# Patient Record
Sex: Male | Born: 1960
Health system: Southern US, Community
[De-identification: ages and names within clinical notes are randomized; demographics above are authoritative.]

## PROBLEM LIST (undated history)

## (undated) DIAGNOSIS — G35 Multiple sclerosis: Secondary | ICD-10-CM

## (undated) HISTORY — DX: Multiple sclerosis: G35

---

## 2004-11-06 HISTORY — PX: CARDIAC CATHETERIZATION: SHX172

## 2005-04-21 ENCOUNTER — Encounter: Admission: RE | Admit: 2005-04-21 | Discharge: 2005-04-21 | Payer: Self-pay | Admitting: Internal Medicine

## 2005-05-23 ENCOUNTER — Encounter: Admission: RE | Admit: 2005-05-23 | Discharge: 2005-05-23 | Payer: Self-pay | Admitting: *Deleted

## 2005-05-25 ENCOUNTER — Ambulatory Visit (HOSPITAL_COMMUNITY): Admission: RE | Admit: 2005-05-25 | Discharge: 2005-05-26 | Payer: Self-pay | Admitting: *Deleted

## 2005-06-27 ENCOUNTER — Encounter: Admission: RE | Admit: 2005-06-27 | Discharge: 2005-06-27 | Payer: Self-pay | Admitting: Internal Medicine

## 2005-07-12 ENCOUNTER — Ambulatory Visit (HOSPITAL_COMMUNITY): Admission: RE | Admit: 2005-07-12 | Discharge: 2005-07-12 | Payer: Self-pay | Admitting: Neurology

## 2005-08-31 ENCOUNTER — Encounter (INDEPENDENT_AMBULATORY_CARE_PROVIDER_SITE_OTHER): Payer: Self-pay | Admitting: Specialist

## 2005-08-31 ENCOUNTER — Ambulatory Visit: Admission: RE | Admit: 2005-08-31 | Discharge: 2005-08-31 | Payer: Self-pay | Admitting: Neurology

## 2005-11-15 IMAGING — CR DG CHEST 2V
2 series · 2 of 2 positions shown · non-contrast
Comparison: none

CLINICAL DATA: Preop respiratory exam for cardiac procedure.
 TWO VIEW CHEST:
 Cardiomediastinal silhouette is unremarkable.  The lungs are clear.  No pleural effusions or pneumothorax.  The visualized upper abdomen and bony thorax are within normal limits.

[w chest pa]
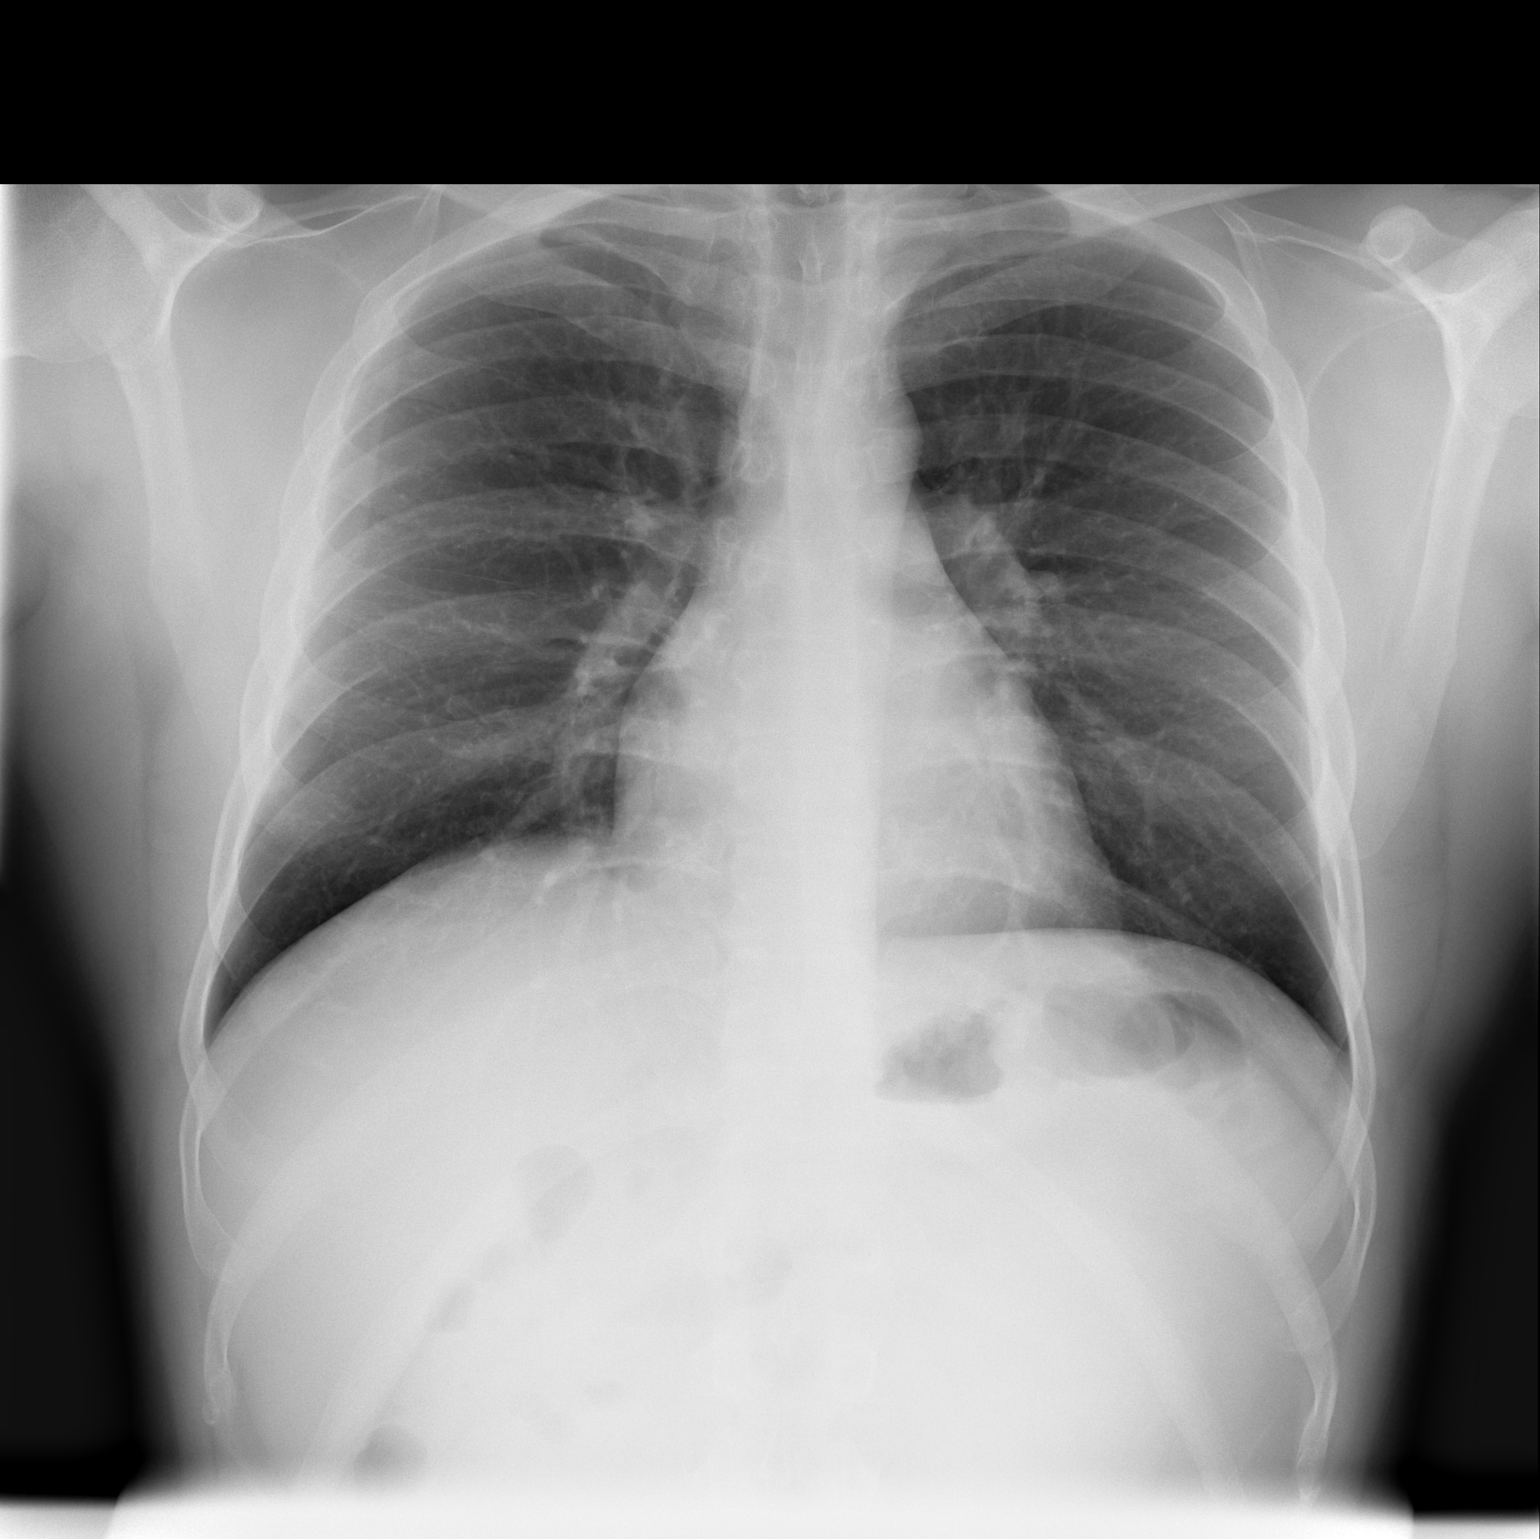

[w chest lat]
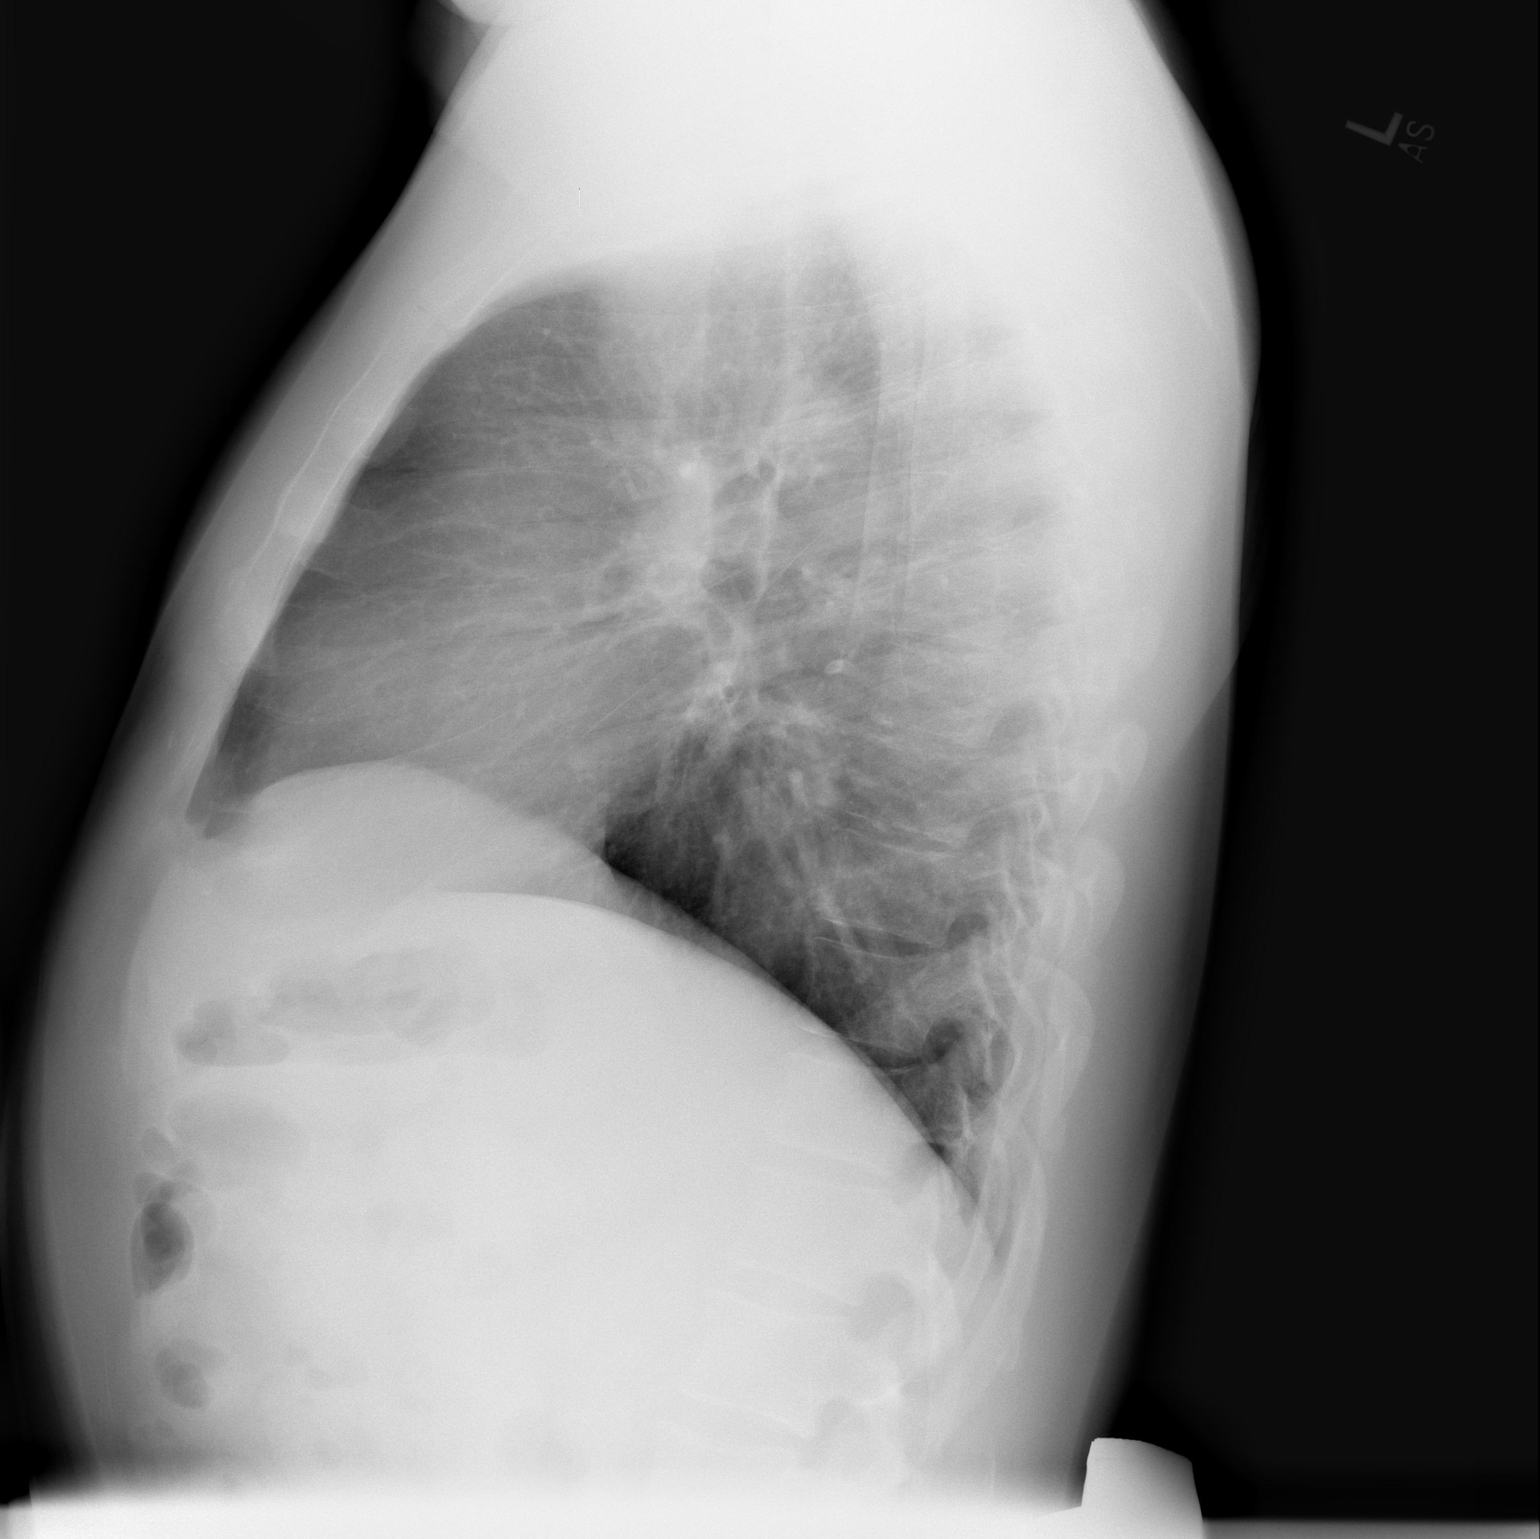

[2 of 2 positions shown; findings below may reference images not displayed]

IMPRESSION: No evidence of active cardiopulmonary disease.

## 2008-07-02 ENCOUNTER — Ambulatory Visit (HOSPITAL_COMMUNITY): Admission: RE | Admit: 2008-07-02 | Discharge: 2008-07-02 | Payer: Self-pay | Admitting: Neurology

## 2008-11-06 DIAGNOSIS — G35 Multiple sclerosis: Secondary | ICD-10-CM | POA: Insufficient documentation

## 2010-09-14 ENCOUNTER — Ambulatory Visit (HOSPITAL_COMMUNITY): Admission: RE | Admit: 2010-09-14 | Discharge: 2010-09-14 | Payer: Self-pay | Admitting: Neurology

## 2010-09-28 ENCOUNTER — Ambulatory Visit (HOSPITAL_COMMUNITY): Admission: RE | Admit: 2010-09-28 | Discharge: 2010-09-28 | Payer: Self-pay | Admitting: Internal Medicine

## 2010-11-26 ENCOUNTER — Encounter: Payer: Self-pay | Admitting: Internal Medicine

## 2011-03-24 NOTE — Op Note (Signed)
Bradley Nicholson, Bradley Nicholson NO.:  [id number]   MEDICAL RECORD NO.:  [id number]          PATIENT TYPE:  OUT   LOCATION:  DFTL                         FACILITY:  [facility]   PHYSICIAN:  Genene Churn. Love, M.D.    DATE OF BIRTH:  [birth year]   DATE OF PROCEDURE:  08/31/2005  DATE OF DISCHARGE:                                 OPERATIVE REPORT   This is a 50 year old gentleman who is being evaluated for an abnormality in  the brain stem on MRI.   TECHNICAL DESCRIPTION:  This patient was prepped and draped in the left  lateral decubitus position using Betadine and 1% Xylocaine.  The L4-L5  interspace was attempted x 3 without success and it was decided to give the  patient Valium and try the procedure under fluoroscopy.  The patient  tolerated the procedure well.           ______________________________  Genene Churn. Sandria Manly, M.D.     JML/MEDQ  D:  08/31/2005  T:  08/31/2005  Job:  [id number]

## 2011-03-24 NOTE — Cardiovascular Report (Signed)
NAMEAADITYA, LETIZIA NO.:  [id number]   MEDICAL RECORD NO.:  [id number]          PATIENT TYPE:  OIB   LOCATION:  2852                         FACILITY:  [facility]   PHYSICIAN:  Mark E. Severiano Gilbert, M.D.    DATE OF BIRTH:  Feb 07, 1961   DATE OF PROCEDURE:  05/25/2005  DATE OF DISCHARGE:                              CARDIAC CATHETERIZATION   PROCEDURES PERFORMED:  1.  Invasive left physiologic procedure with arrhythmia induction.  2.  EP study with isoproterenol infusion.  3.  Non-3-D mapping arhythmia substrate.  4.  Radiofrequency ablation of arrhythmia substrate.   DIAGNOSIS:  Wolff-Parkinson-White syndrome.   SURGEON:  Mark E. Peele, M.D.   ESTIMATED BLOOD LOSS:  Less than 3 mL.   COMPLICATIONS:  None.   DESCRIPTION OF PROCEDURE:  Patient was brought to the EP lab in the fasting  state.  Patient was prepped in the usual manner.  Conscious sedation was  obtained with intermittent injections of midazolam and Fentanyl.  The left  femoral vein was cannulated twice with two 7 [place] short safe sheaths after  anesthesia was obtained with 1% lidocaine without difficulty.  The right  femoral vein was cannulated with an 8 [place] and a 7 [place] short safe  sheath after 1% lidocaine infiltration for anesthesia.  Lastly, the right  femoral artery was cannulated with a 7 [place] safe sheath short.  Via the  venous catheters a quadripolar fix curve catheter was positioned in the  right ventricular apex.  Quadripolar fix curve catheter was positioned in  the right atrial appendage, a hexapolar curve catheter was positioned in the  His bundle electrogram area, and finally a decapolar CS catheter was placed  in the proximal coronary sinus.  Because of difficulty with positioning the  coronary sinus catheter along the left lateral annulus, an EPT standard  curve was easily retrograded across the aortic valve under fluoroscopic  guidance and positioned along the left lateral annulus  for determining an  annular P-wave.  Baseline electrocardiographic rhythm demonstrated normal  sinus rhythm, bradycardia, cycle length was 1278 msec.  There was a grossly  preexcited QRS consistent with a diagnosis of WPW.  The HV interval was 0  msec. The AH time was 89 msec.  The retrograde block cycle length for  Wenckebach was 380 msec, retrograde activation was nondecremental and  eccentric.  The antegrade block cycle length of AV conduction down the  accessory pathway was 330 msec consistent with a nonmalignant accessory  pathway based on antegrade conduction properties. There was no easily  induced wide or narrow complex tachycardia.  Because of the diagnosis of  WPW, the standard curve EPT catheter was used to map the mitral annulus both  below the valve as well as on the annulus of the mitral valve.  It  is very  easy to map earliest retrograde and antegrade excitation to the left lateral  mitral annulus consistent with surface EKG vector of the Delta wave.  After  mapping, a site was obtained underneath the valve with excellent AV  electrogram consistent with pathway function.  Radiofrequency  energy was  applied with excellent heating characteristics to 50 to 60% degrees C.  There was rapid loss of antegrade conduction of the accessory pathway.  After a one minute total burn, two 15-second reinforcing burns were  performed.  All catheters were removed from the body after waiting 25  minutes without return of preexcitation.  While the patient was being taken  off the table, it was noticed that his EKG reverted back to her preexcited  rhythm.  Since we had not completely broken the field and access was still  available, we reprepped the area.  A quadripolar catheter was advanced into  the high right atrium and a quadripolar catheter was positioned in the right  ventricle for atrial and ventricular backup pacing.  Both a standard curve  and a small curve EPT catheter was used to map  the mitral valve annulus.  Again, a desirable site was obtained.  Multiple applications of  radiofrequency energy were performed all with transient loss of antegrade  preexcitation which returned immediately after ending the radiofrequency  application.  Most of these were related to relatively poor heating  characteristics, although the final burn performed in view of the retrograde  aortic approach at temperature characteristics of 50 to 55 degrees C.  Because of the time of the procedure and the reluctance to perform a  transeptal puncture after anticoagulation in retrograde aortic approach,,  their case was concluded.  All catheters and sheaths were removed without  difficulty.  Hemostasis was obtained with direct pressure.  The patient  tolerated the procedure well and recovered well from conscious sedation, was  hemodynamically stable throughout the case.   FINDINGS:  Left lateral accessory pathway with antegrade and retrograde  properties.  Retrograde properties at baseline are consistent with  initiation of a tachycardia of approximately 150 beats per minute.  Antegrade properties of the accessory pathway were clearly nonmalignant with  block cycle length of approximately 300 to 350 msec.   PLAN:  1.  Aspirin therapy 325 mg p.o. daily for one month given left-sided      ablation.  2.  Consideration of freedom from rhythm based on partial ablation of      accessory pathway conduction properties.  3.  Consideration of either a class I agent or return for transeptal      approach to ablate the accessory pathway.       MEP/MEDQ  D:  05/25/2005  T:  05/25/2005  Job:  [id number]

## 2011-12-26 ENCOUNTER — Encounter: Payer: Self-pay | Admitting: Pharmacist

## 2011-12-26 ENCOUNTER — Ambulatory Visit (INDEPENDENT_AMBULATORY_CARE_PROVIDER_SITE_OTHER): Payer: Self-pay | Admitting: Pharmacist

## 2011-12-26 VITALS — BP 138/81 | HR 64 | Ht 72.0 in | Wt 200.0 lb

## 2011-12-26 DIAGNOSIS — G35 Multiple sclerosis: Secondary | ICD-10-CM

## 2011-12-26 DIAGNOSIS — G35D Multiple sclerosis, unspecified: Secondary | ICD-10-CM

## 2011-12-26 NOTE — Progress Notes (Signed)
  Subjective:    Patient ID: Bradley Nicholson, male    DOB: 19-Oct-1961, 51 y.o.   MRN: [id number]  HPI Patient arrives in good spirits for medication review.  Reports seeing Dr. Sandria Manly as primary care provider.  Reports being diagnosed with Multiple Sclerosis for 3 years and states she is under acceptable level of control.      Review of Systems     Objective:   Physical Exam        Assessment & Plan:  Following medication review, no suggestions for change.  Complete medication list provided to patient.  Total time in face to face medication review: 10 minutes.  Patient seen with: Su Hilt, PharmD Candidate and Albertine Grates, Pharmacy Resident.

## 2011-12-26 NOTE — Progress Notes (Signed)
  Subjective:    Patient ID: Bradley Nicholson, male    DOB: 1961-05-11, 51 y.o.   MRN: [id number]  HPI Reviewed and agree with Dr. Macky Lower management.    Review of Systems     Objective:   Physical Exam        Assessment & Plan:

## 2011-12-26 NOTE — Assessment & Plan Note (Signed)
Following medication review, no suggestions for change.  Complete medication list provided to patient.  Total time in face to face medication review: 10 minutes.  Patient seen with: Su Hilt, PharmD Candidate and Albertine Grates, Pharmacy Resident.

## 2013-07-23 ENCOUNTER — Other Ambulatory Visit (HOSPITAL_COMMUNITY): Payer: Self-pay | Admitting: Neurology

## 2013-07-25 ENCOUNTER — Telehealth: Payer: Self-pay | Admitting: Neurology

## 2013-07-25 NOTE — Telephone Encounter (Signed)
Reassigned to Dr. Penumalli. 

## 2013-07-25 NOTE — Telephone Encounter (Signed)
Patient has not been seen since 02/2012.  Former Love patient needs assignment and appt.  Sent message to triage for assignment.

## 2013-07-28 NOTE — Telephone Encounter (Signed)
Patient has been assigned to Dr Marjory Lies

## 2013-08-25 ENCOUNTER — Other Ambulatory Visit (HOSPITAL_COMMUNITY): Payer: Self-pay | Admitting: Diagnostic Neuroimaging

## 2013-11-10 ENCOUNTER — Ambulatory Visit: Payer: Self-pay | Admitting: Diagnostic Neuroimaging

## 2013-12-15 ENCOUNTER — Other Ambulatory Visit (HOSPITAL_COMMUNITY): Payer: Self-pay | Admitting: Diagnostic Neuroimaging

## 2013-12-15 NOTE — Telephone Encounter (Signed)
This is a former Love patient who has not been seen since 02/2012.  He owes a CB balance and No showed the last appt he was scheduled for in Jan 2015.  I called the home number we have on file.  Phone rang several times, them a message picked up asking to enter the remote access code.  I called the work number on file, it did not ring.  Message asked to enter the phone number.  I called a cell number we had on file in [employer] [phone], each time I called, the phone would ring twice then it would start clicking and disconnect.  I called the pharmacy to see if they had a different number for the patient.  They had [phone].  I called this number.  Spoke with the patient.  He said he will call the office to discuss no show and appt.

## 2013-12-30 ENCOUNTER — Ambulatory Visit (INDEPENDENT_AMBULATORY_CARE_PROVIDER_SITE_OTHER): Payer: 59 | Admitting: Diagnostic Neuroimaging

## 2013-12-30 ENCOUNTER — Encounter: Payer: Self-pay | Admitting: Diagnostic Neuroimaging

## 2013-12-30 ENCOUNTER — Encounter (INDEPENDENT_AMBULATORY_CARE_PROVIDER_SITE_OTHER): Payer: Self-pay

## 2013-12-30 VITALS — BP 108/69 | HR 59 | Temp 97.9°F | Ht 71.25 in | Wt 204.0 lb

## 2013-12-30 DIAGNOSIS — G35 Multiple sclerosis: Secondary | ICD-10-CM

## 2013-12-30 NOTE — Progress Notes (Signed)
[facility]  PATIENT: Bradley Nicholson DOB: 12/15/1960  REFERRING CLINICIAN:  HISTORY FROM: patient and wife REASON FOR VISIT: follow up (transfer, Dr. Sandria Manly)   HISTORICAL  CHIEF COMPLAINT:  Chief Complaint  Patient presents with  . Establish Care    Prior pt of Dr. Sandria Manly  . Multiple Sclerosis    HISTORY OF PRESENT ILLNESS:   UPDATE 12/30/13: Since last visit patient doing well. No further neurologic symptoms. Patient is tolerating Rebif injections. No skin site reactions.  I reviewed patient's initial symptoms in August 2006. Patient tells me that 2 weeks prior to onset of double vision 2006, patient had had palpitations, diagnosed with Wolff-Parkinson-White syndrome, and underwent cardiac catheterization with radiofrequency ablation. When patient developed double vision, he had woke up with symptoms. Symptoms lasted approximately 2 days.  PRIOR HPI (02/23/12, Dr. Sandria Manly): "53 year old right-handed married African American male first seen  06/30/05 for dizziness and double vision with an abnormal MRI study of the brain  06/27/05 showing a 3-3.5 mm lesion in the brainstem midbrain on the left, adjacent to the aqueduct near the  superior collicus. CBC, CMP, ANA, lipid profile was normal except LDL 155. Examination revealed acuity at 20/20 right eye, 20/30 in the left and red lens showed vertical diplopia. LP 08/31/05 showed negative ACE  and VDRL with glucose 54, protein 33, no red blood cells, one white blood cell, and positive CSF oligoclonal banding with normal CSF IgG index. He began rebif on Mondays, Wednesdays, and Fridays 12/06. He has not had significant side effects from the medication. He denies bowel or bladder incontinence. He has no history of Lhermitte's sign. His memory has been good. He has no injection site sores. His last blood studies 06/03/10 were normal CBC and CMP. MRIs of the brain and cervical spine 09/14/2010 without and with contrast enhancement were  normal except for degenerative changes at C4-5. 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 He exercises on a regular basis."   REVIEW OF SYSTEMS: Full 14 system review of systems performed and notable only for as per history of present illness. Otherwise negative.  ALLERGIES: No Known Allergies  HOME MEDICATIONS: Outpatient Prescriptions Prior to Visit  Medication Sig Dispense Refill  . interferon beta-1a (REBIF) 44 MCG/0.5ML injection Inject 0.5 mLs (44 mcg total) into the skin 3 (three) times a week.  6 mL  0   No facility-administered medications prior to visit.    PAST MEDICAL HISTORY: History reviewed. No pertinent past medical history.  PAST SURGICAL HISTORY: Past Surgical History  Procedure Laterality Date  . Cardiac catheterization  2006    FAMILY HISTORY: Family History  Problem Relation Age of Onset  . Family history unknown: Yes    SOCIAL HISTORY:  History   Social History  . Marital Status: Married    Spouse Name: N/A    Number of Children: 1  . Years of Education: N/A   Occupational History  .  [employer]   Social History Main Topics  . Smoking status: Never Smoker   . Smokeless tobacco: Never Used  . Alcohol Use: Yes     Comment: 1 beverage  per week  . Drug Use: No  . Sexual Activity: Not on file   Other Topics Concern  . Not on file   Social History Narrative   Patient lives at home with his spouse.   Caffeine Use: does consume     PHYSICAL EXAM  Filed Vitals:  12/30/13 1033  BP: 108/69  Pulse: 59  Temp: 97.9 F (36.6 C)  TempSrc: Oral  Height: 5' 11.25" (1.81 m)  Weight: 204 lb (92.534 kg)    Not recorded    Body mass index is 28.25 kg/(m^2).  GENERAL EXAM: Patient is in no distress; well developed, nourished and groomed; neck is supple  CARDIOVASCULAR: Regular rate and rhythm, no murmurs, no carotid bruits  NEUROLOGIC: MENTAL STATUS: awake, alert, oriented to person, place and time, recent  and remote memory intact, normal attention and concentration, language fluent, comprehension intact, naming intact, fund of knowledge appropriate CRANIAL NERVE: no papilledema on fundoscopic exam, pupils equal and reactive to light, visual fields full to confrontation, extraocular muscles intact, no nystagmus, facial sensation and strength symmetric, hearing intact, palate elevates symmetrically, uvula midline, shoulder shrug symmetric, tongue midline. MOTOR: normal bulk and tone, full strength in the BUE, BLE SENSORY: normal and symmetric to light touch, pinprick, temperature, vibration COORDINATION: finger-nose-finger, fine finger movements normal REFLEXES: deep tendon reflexes present and symmetric GAIT/STATION: narrow based gait; able to walk on toes, heels and tandem; romberg is negative    DIAGNOSTIC DATA (LABS, IMAGING, TESTING) - I reviewed patient records, labs, notes, testing and imaging myself where available.  No results found for this basename: WBC, HGB, HCT, MCV, PLT   No results found for this basename: na, k, cl, co2, glucose, bun, creatinine, calcium, prot, albumin, ast, alt, alkphos, bilitot, gfrnonaa, gfraa   No results found for this basename: CHOL, HDL, LDLCALC, LDLDIRECT, TRIG, CHOLHDL   No results found for this basename: HGBA1C   No results found for this basename: VITAMINB12   No results found for this basename: TSH   I reviewed images myself, but do not agree with brain interpretation. I have reported my interpretation below. -VRP  09/15/10 MRI brain - subtle T2 hyperintense lesion in the midbrain on the left adjacent to the aqueduct, near the superior colliculus and fourth cranial nerve nucleus. Stable from prior studies. No abnl enhancement.  09/15/10 MRI cervical spine: 1. Stable, normal MRI appearance of the cervical and visualized thoracic spinal cord.  2. No acute findings in the cervical spine. Cervical degenerative changes, maximal at  C4-C5.    ASSESSMENT AND PLAN  53 y.o. year old male here with multiple sclerosis since 2006. Symptoms stable on rebif.  PLAN: - continue rebif - annual CBC, LFT with PCP - annual lipid, diabetes and BP screening with PCP - consider surveillance MRI brain next year   Return in about 1 year (around 12/30/2014).    Suanne MarkerVIKRAM R. Jnaya Butrick, MD 12/30/2013, 11:24 AM Certified in Neurology, Neurophysiology and Neuroimaging  [facility] [address], [address] [place], [place] [address] [phone]

## 2013-12-30 NOTE — Patient Instructions (Signed)
Continue rebif.  Have PCP check CBC, CMP, lipid panel, fasting sugar.

## 2014-02-02 ENCOUNTER — Other Ambulatory Visit (HOSPITAL_COMMUNITY): Payer: Self-pay | Admitting: Diagnostic Neuroimaging

## 2014-10-15 ENCOUNTER — Telehealth: Payer: Self-pay | Admitting: Diagnostic Neuroimaging

## 2014-10-15 NOTE — Telephone Encounter (Signed)
I have contacted ins and provided them will all clinical information requested.  They will review this info to determine if they can grant an exception and cover this med.  They will notify patient of outcome once decision has been made.  I called the patient back.  Got no answer.  Left message.

## 2014-10-15 NOTE — Telephone Encounter (Signed)
Pt's insurance is no longer covering REBIF 44 MCG/0.5ML injection.  Insurance gave him 2 options Avonex Injection or Ampyra.  Please call and advise.  Patient prefers to have Avonex.  Please call and advise.

## 2014-12-30 ENCOUNTER — Ambulatory Visit (INDEPENDENT_AMBULATORY_CARE_PROVIDER_SITE_OTHER): Payer: 59 | Admitting: Diagnostic Neuroimaging

## 2014-12-30 ENCOUNTER — Encounter: Payer: Self-pay | Admitting: Diagnostic Neuroimaging

## 2014-12-30 VITALS — BP 128/81 | HR 55 | Temp 97.3°F | Ht 71.0 in | Wt 209.6 lb

## 2014-12-30 DIAGNOSIS — G35 Multiple sclerosis: Secondary | ICD-10-CM

## 2014-12-30 NOTE — Progress Notes (Signed)
[facility]  PATIENT: Bradley Nicholson DOB: [birth year]  REFERRING CLINICIAN:  HISTORY FROM: patient and wife REASON FOR VISIT: follow up (transfer, Dr. Sandria Manly)   HISTORICAL  CHIEF COMPLAINT:  Chief Complaint  Patient presents with  . Follow-up    MS    HISTORY OF PRESENT ILLNESS:   UPDATE 12/30/14: Since last visit, doing about the same. No new neuro symptoms. Some intermittent cramps in hands. Tolerating rebif.  UPDATE 12/30/13: Since last visit patient doing well. No further neurologic symptoms. Patient is tolerating Rebif injections. No skin site reactions. I reviewed patient's initial symptoms in August 2006. Patient tells me that 2 weeks prior to onset of double vision 2006, patient had had palpitations, diagnosed with Wolff-Parkinson-White syndrome, and underwent cardiac catheterization with radiofrequency ablation. When patient developed double vision, he had woke up with symptoms. Symptoms lasted approximately 2 days.  PRIOR HPI (02/23/12, Dr. Sandria Manly): "54 year old right-handed married African American male first seen 06/30/05 for dizziness and double vision with an abnormal MRI study of the brain  06/27/05 showing a 3-3.5 mm lesion in the brainstem midbrain on the left, adjacent to the aqueduct near the  superior collicus. CBC, CMP, ANA, lipid profile was normal except LDL 155. Examination revealed acuity at 20/20 right eye, 20/30 in the left and red lens showed vertical diplopia. LP 08/31/05 showed negative ACE  and VDRL with glucose 54, protein 33, no red blood cells, one white blood cell, and positive CSF oligoclonal banding with normal CSF IgG index. He began rebif on Mondays, Wednesdays, and Fridays 12/06. He has not had significant side effects from the medication. He denies bowel or bladder incontinence. He has no history of Lhermitte's sign. His memory has been good. He has no injection site sores. His last blood studies 06/03/10 were normal CBC and CMP. MRIs of  the brain and cervical spine 09/14/2010 without and with contrast enhancement were normal except for degenerative changes at C4-5. 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 He exercises on a regular basis."   REVIEW OF SYSTEMS: Full 14 system review of systems performed and notable only for as per history of present illness. Otherwise negative.  ALLERGIES: No Known Allergies  HOME MEDICATIONS: Outpatient Prescriptions Prior to Visit  Medication Sig Dispense Refill  . REBIF 44 MCG/0.5ML injection INJECT 0.5 MLS (44 MCG TOTAL) INTO THE SKIN 3 (THREE) TIMES A WEEK. 6 mL 12   No facility-administered medications prior to visit.    PAST MEDICAL HISTORY: History reviewed. No pertinent past medical history.  PAST SURGICAL HISTORY: Past Surgical History  Procedure Laterality Date  . Cardiac catheterization  2006    FAMILY HISTORY: Family History  Problem Relation Age of Onset  . Healthy Mother   . Healthy Father     SOCIAL HISTORY:  History   Social History  . Marital Status: Married    Spouse Name: N/A  . Number of Children: 1  . Years of Education: N/A   Occupational History  .  [employer]   Social History Main Topics  . Smoking status: Never Smoker   . Smokeless tobacco: Never Used  . Alcohol Use: Yes     Comment: 1 beverage  per week  . Drug Use: No  . Sexual Activity: Not on file   Other Topics Concern  . Not on file   Social History Narrative   Patient lives at home with his spouse.   Caffeine Use: does consume  PHYSICAL EXAM  Filed Vitals:   12/30/14 0823  BP: 128/81  Pulse: 55  Temp: 97.3 F (36.3 C)  TempSrc: Oral  Height:  (1.803 m)  Weight: 209 lb 9.6 oz (95.074 kg)    Not recorded      Body mass index is 29.25 kg/(m^2).  GENERAL EXAM: Patient is in no distress; well developed, nourished and groomed; neck is supple  CARDIOVASCULAR: Regular rate and rhythm, no murmurs, no carotid  bruits  NEUROLOGIC: MENTAL STATUS: awake, alert, language fluent, comprehension intact, naming intact, fund of knowledge appropriate CRANIAL NERVE: pupils equal and reactive to light, visual fields full to confrontation, extraocular muscles intact, no nystagmus, facial sensation and strength symmetric, hearing intact, palate elevates symmetrically, uvula midline, shoulder shrug symmetric, tongue midline. MOTOR: normal bulk and tone, full strength in the BUE, BLE SENSORY: normal and symmetric to light touch, temperature, vibration COORDINATION: finger-nose-finger, fine finger movements normal REFLEXES: deep tendon reflexes present and symmetric; BRISK AT KNEES; ANKLES 1. GAIT/STATION: narrow based gait; able to walk tandem; romberg is negative    DIAGNOSTIC DATA (LABS, IMAGING, TESTING) - I reviewed patient records, labs, notes, testing and imaging myself where available.  No results found for: WBC No results found for: NA No results found for: CHOL No results found for: HGBA1C No results found for: VITAMINB12 No results found for: TSH   I reviewed images myself, but do not agree with MRI brain report. I have reported my interpretation below. -VRP  09/15/10 MRI brain - (report states normal brain); However I reviewed images myself and there is a subtle T2 hyperintense lesion in the midbrain on the left adjacent to the aqueduct, near the superior colliculus and fourth cranial nerve nucleus. Stable from prior studies. No abnl enhancement. -VRP  09/15/10 MRI cervical spine: 1. Stable, normal MRI appearance of the cervical and visualized thoracic spinal cord.  2. No acute findings in the cervical spine. Cervical degenerative changes, maximal at C4-C5.    ASSESSMENT AND PLAN  54 y.o. year old male here with multiple sclerosis since 2006. Symptoms stable on rebif.  PLAN: - continue rebif - check CBC, CMP - surveillance MRI brain and cervical spine   Orders Placed This Encounter   Procedures  . MR Brain W Wo Contrast  . MR Cervical Spine W Wo Contrast  . CBC with Differential/Platelet  . Comprehensive metabolic panel   Return in about 6 months (around 06/30/2015).    Suanne Marker, MD 12/30/2014, 8:56 AM Certified in Neurology, Neurophysiology and Neuroimaging  [facility] [address], [address] [place], [place] [address] [phone]

## 2014-12-30 NOTE — Patient Instructions (Signed)
I will check MRI scans and labs.   Continue rebif.

## 2014-12-31 LAB — CBC WITH DIFFERENTIAL/PLATELET
Basophils Absolute: 0 10*3/uL (ref 0.0–0.2)
Basos: 1 %
Eos: 1 %
Eosinophils Absolute: 0.1 10*3/uL (ref 0.0–0.4)
HCT: 40.6 % (ref 37.5–51.0)
Hemoglobin: 13.8 g/dL (ref 12.6–17.7)
Immature Grans (Abs): 0 10*3/uL (ref 0.0–0.1)
Immature Granulocytes: 0 %
Lymphocytes Absolute: 1.6 10*3/uL (ref 0.7–3.1)
Lymphs: 31 %
MCH: 29.9 pg (ref 26.6–33.0)
MCHC: 34 g/dL (ref 31.5–35.7)
MCV: 88 fL (ref 79–97)
Monocytes Absolute: 0.5 10*3/uL (ref 0.1–0.9)
Monocytes: 10 %
Neutrophils Absolute: 2.9 10*3/uL (ref 1.4–7.0)
Neutrophils Relative %: 57 %
Platelets: 211 10*3/uL (ref 150–379)
RBC: 4.61 x10E6/uL (ref 4.14–5.80)
RDW: 14.4 % (ref 12.3–15.4)
WBC: 5 10*3/uL (ref 3.4–10.8)

## 2014-12-31 LAB — COMPREHENSIVE METABOLIC PANEL
ALT: 40 IU/L (ref 0–44)
AST: 27 IU/L (ref 0–40)
Albumin/Globulin Ratio: 1.7 (ref 1.1–2.5)
Albumin: 4.4 g/dL (ref 3.5–5.5)
Alkaline Phosphatase: 61 IU/L (ref 39–117)
BUN/Creatinine Ratio: 15 (ref 9–20)
BUN: 16 mg/dL (ref 6–24)
Bilirubin Total: 0.6 mg/dL (ref 0.0–1.2)
CO2: 25 mmol/L (ref 18–29)
Calcium: 9.6 mg/dL (ref 8.7–10.2)
Chloride: 102 mmol/L (ref 97–108)
Creatinine, Ser: 1.05 mg/dL (ref 0.76–1.27)
GFR calc Af Amer: 93 mL/min/{1.73_m2} (ref >59)
GFR calc non Af Amer: 80 mL/min/{1.73_m2} (ref >59)
Globulin, Total: 2.6 g/dL (ref 1.5–4.5)
Glucose: 92 mg/dL (ref 65–99)
Potassium: 4.6 mmol/L (ref 3.5–5.2)
Sodium: 142 mmol/L (ref 134–144)
Total Protein: 7 g/dL (ref 6.0–8.5)

## 2015-01-06 ENCOUNTER — Ambulatory Visit (INDEPENDENT_AMBULATORY_CARE_PROVIDER_SITE_OTHER): Payer: 59

## 2015-01-06 DIAGNOSIS — G35 Multiple sclerosis: Secondary | ICD-10-CM

## 2015-01-07 MED ORDER — GADOPENTETATE DIMEGLUMINE 469.01 MG/ML IV SOLN: 20.0000 mL | Freq: Once | INTRAVENOUS | Status: AC | PRN

## 2015-01-25 ENCOUNTER — Encounter: Payer: Self-pay | Admitting: *Deleted

## 2015-03-08 ENCOUNTER — Other Ambulatory Visit: Payer: Self-pay | Admitting: Diagnostic Neuroimaging

## 2015-06-30 ENCOUNTER — Ambulatory Visit: Payer: Self-pay | Admitting: Diagnostic Neuroimaging

## 2015-06-30 ENCOUNTER — Telehealth: Payer: Self-pay | Admitting: *Deleted

## 2015-06-30 NOTE — Telephone Encounter (Signed)
Pt No showed for appt today.

## 2015-07-01 ENCOUNTER — Encounter: Payer: Self-pay | Admitting: Diagnostic Neuroimaging

## 2015-07-22 ENCOUNTER — Encounter: Payer: Self-pay | Admitting: Diagnostic Neuroimaging

## 2015-07-22 ENCOUNTER — Ambulatory Visit (INDEPENDENT_AMBULATORY_CARE_PROVIDER_SITE_OTHER): Payer: 59 | Admitting: Diagnostic Neuroimaging

## 2015-07-22 VITALS — BP 119/68 | HR 78 | Ht 71.0 in | Wt 201.0 lb

## 2015-07-22 DIAGNOSIS — G35 Multiple sclerosis: Secondary | ICD-10-CM

## 2015-07-22 NOTE — Patient Instructions (Signed)
Continue rebif.

## 2015-07-22 NOTE — Progress Notes (Signed)
[facility]  PATIENT: Bradley Nicholson DOB: 1961-05-24  REFERRING CLINICIAN:  HISTORY FROM: patient and wife REASON FOR VISIT: follow up (former Dr. Sandria Manly patient)   HISTORICAL  CHIEF COMPLAINT:  Chief Complaint  Patient presents with  . Multiple sclerosis    rm 7  . Follow-up    6 months    HISTORY OF PRESENT ILLNESS:   UPDATE 07/22/15: Doing well. No new neurologic symptoms. Tolerating rebif. MRI scans and labs are stable.  UPDATE 12/30/14: Since last visit, doing about the same. No new neuro symptoms. Some intermittent cramps in hands. Tolerating rebif.  UPDATE 12/30/13: Since last visit patient doing well. No further neurologic symptoms. Patient is tolerating Rebif injections. No skin site reactions. I reviewed patient's initial symptoms in August 2006. Patient tells me that 2 weeks prior to onset of double vision 2006, patient had had palpitations, diagnosed with Wolff-Parkinson-White syndrome, and underwent cardiac catheterization with radiofrequency ablation. When patient developed double vision, he had woke up with symptoms. Symptoms lasted approximately 2 days.  PRIOR HPI (02/23/12, Dr. Sandria Manly): "54 year old right-handed married African American male first seen 06/30/05 for dizziness and double vision with an abnormal MRI study of the brain  06/27/05 showing a 3-3.5 mm lesion in the brainstem midbrain on the left, adjacent to the aqueduct near the  superior collicus. CBC, CMP, ANA, lipid profile was normal except LDL 155. Examination revealed acuity at 20/20 right eye, 20/30 in the left and red lens showed vertical diplopia. LP 08/31/05 showed negative ACE  and VDRL with glucose 54, protein 33, no red blood cells, one white blood cell, and positive CSF oligoclonal banding with normal CSF IgG index. He began rebif on Mondays, Wednesdays, and Fridays 12/06. He has not had significant side effects from the medication. He denies bowel or bladder incontinence. He has no  history of Lhermitte's sign. His memory has been good. He has no injection site sores. His last blood studies 06/03/10 were normal CBC and CMP. MRIs of the brain and cervical spine 09/14/2010 without and with contrast enhancement were normal except for degenerative changes at C4-5. 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 He exercises on a regular basis."    REVIEW OF SYSTEMS: Full 14 system review of systems performed and notable only for as per history of present illness. Otherwise negative.  ALLERGIES: No Known Allergies  HOME MEDICATIONS: Outpatient Prescriptions Prior to Visit  Medication Sig Dispense Refill  . REBIF 44 MCG/0.5ML SOSY injection INJECT 0.5 MLS (44 MCG TOTAL) INTO THE SKIN 3 (THREE) TIMES A WEEK. 12 Syringe 6   No facility-administered medications prior to visit.    PAST MEDICAL HISTORY: Past Medical History  Diagnosis Date  . Multiple sclerosis     PAST SURGICAL HISTORY: Past Surgical History  Procedure Laterality Date  . Cardiac catheterization  2006    FAMILY HISTORY: Family History  Problem Relation Age of Onset  . Healthy Mother   . Healthy Father     SOCIAL HISTORY:  Social History   Social History  . Marital Status: Married    Spouse Name: N/A  . Number of Children: 1  . Years of Education: N/A   Occupational History  .  [employer]   Social History Main Topics  . Smoking status: Never Smoker   . Smokeless tobacco: Never Used  . Alcohol Use: Yes     Comment: 1 beverage  per week  . Drug Use: No  .  Sexual Activity: Not on file   Other Topics Concern  . Not on file   Social History Narrative   Patient lives at home with his spouse.   Caffeine Use: does consume     PHYSICAL EXAM  Filed Vitals:   07/22/15 1443  BP: 119/68  Pulse: 78  Height: 5\' 11"  (1.803 m)  Weight: 201 lb (91.173 kg)    Not recorded      Body mass index is 28.05 kg/(m^2).  GENERAL EXAM: Patient is in no distress; well  developed, nourished and groomed; neck is supple  CARDIOVASCULAR: Regular rate and rhythm, no murmurs, no carotid bruits  NEUROLOGIC: MENTAL STATUS: awake, alert, language fluent, comprehension intact, naming intact, fund of knowledge appropriate CRANIAL NERVE: pupils equal and reactive to light, visual fields full to confrontation, extraocular muscles intact, no nystagmus, facial sensation and strength symmetric, hearing intact, palate elevates symmetrically, uvula midline, shoulder shrug symmetric, tongue midline. MOTOR: normal bulk and tone, full strength in the BUE, BLE SENSORY: normal and symmetric to light touch, temperature, vibration COORDINATION: finger-nose-finger, fine finger movements normal REFLEXES: deep tendon reflexes present and symmetric; BRISK AT KNEES; ANKLES 1. GAIT/STATION: narrow based gait; able to walk tandem; romberg is negative    DIAGNOSTIC DATA (LABS, IMAGING, TESTING) - I reviewed patient records, labs, notes, testing and imaging myself where available.  Lab Results  Component Value Date   WBC 5.0 12/30/2014      Component Value Date/Time   NA 142 12/30/2014 0916   No results found for: CHOL No results found for: HGBA1C No results found for: VITAMINB12 No results found for: TSH   I reviewed images myself, but do not agree with MRI brain report. I have reported my interpretation below. -VRP  09/15/10 MRI brain - (report states normal brain); However I reviewed images myself and there is a subtle T2 hyperintense lesion in the midbrain on the left adjacent to the aqueduct, near the superior colliculus and fourth cranial nerve nucleus. Stable from prior studies. No abnl enhancement. -VRP  09/15/10 MRI cervical spine: 1. Stable, normal MRI appearance of the cervical and visualized thoracic spinal cord.  2. No acute findings in the cervical spine. Cervical degenerative changes, maximal at C4-C5.  01/06/15 MRI brain (with and without) demonstrating: 1.  Subtle, stable T2 hyperintense lesion near the peri-aqueductal gray on the left side. No abnormal lesions are seen on post contrast views. May represent chronic demyelinating plaque or other non-specific autoimmune/inflammatory etiology. 2. No significant change from MRI on 09/14/10.   01/06/15 MRI cervical spine (with and without) demonstrating: 1. At C4-5: disc bulging and uncovertebral joint hypertrophy with moderate biforaminal stenosis. 2. No intrinsic or abnormal enhancing spinal cord lesions. 3. No significant change from MRI on 09/14/10.   ASSESSMENT AND PLAN  54 y.o. year old male here with multiple sclerosis since 2006. Symptoms stable on rebif.  PLAN: - continue rebif - check vitamin D levels  Orders Placed This Encounter  Procedures  . Vit D  25 hydroxy (rtn osteoporosis monitoring)   Return in about 1 year (around 07/21/2016).    Suanne Marker, MD 07/22/2015, 3:02 PM Certified in Neurology, Neurophysiology and Neuroimaging  [facility] [address], [address] [place], [place] [address] [phone]

## 2015-07-23 LAB — VITAMIN D 25 HYDROXY (VIT D DEFICIENCY, FRACTURES): Vit D, 25-Hydroxy: 12.3 ng/mL — ABNORMAL LOW (ref 30.0–100.0)

## 2015-07-26 ENCOUNTER — Telehealth: Payer: Self-pay | Admitting: *Deleted

## 2015-07-26 NOTE — Telephone Encounter (Signed)
Spoke with patient and advised him, per Dr Marjory Lies, his Vit D level is low, and Dr Marjory Lies advises him to start OTC Vit D 2000 units a day. He correctly verbalized these instructions, verbalized understanding.

## 2015-07-26 NOTE — Telephone Encounter (Signed)
-----   Message from Suanne Marker, MD sent at 07/23/2015  3:51 PM EDT ----- pls call patient. He should be on vit D 2000 units daily. -VRP

## 2015-11-11 ENCOUNTER — Other Ambulatory Visit: Payer: Self-pay | Admitting: Diagnostic Neuroimaging

## 2015-11-12 MED FILL — REBIF 44 MCG/0.5ML SOSY: 44 | 28 days supply | Qty: 6 | Fill #0

## 2015-12-23 MED FILL — REBIF 44 MCG/0.5ML SOSY: 44 | 28 days supply | Qty: 6 | Fill #1

## 2016-02-04 MED FILL — REBIF 44 MCG/0.5ML SOSY: 44 | 28 days supply | Qty: 6 | Fill #2

## 2016-03-15 MED FILL — REBIF 44 MCG/0.5ML SOSY: 44 | 28 days supply | Qty: 6 | Fill #3

## 2016-04-27 MED FILL — REBIF 44 MCG/0.5ML SOSY: 44 | 28 days supply | Qty: 6 | Fill #4

## 2016-05-10 ENCOUNTER — Ambulatory Visit (HOSPITAL_BASED_OUTPATIENT_CLINIC_OR_DEPARTMENT_OTHER): Payer: Self-pay | Admitting: Pharmacist

## 2016-05-10 DIAGNOSIS — G35 Multiple sclerosis: Secondary | ICD-10-CM

## 2016-05-11 MED ORDER — INTERFERON BETA-1A 44 MCG/0.5ML ~~LOC~~ SOSY: 44.0000 ug | PREFILLED_SYRINGE | SUBCUTANEOUS | Status: DC

## 2016-05-11 NOTE — Progress Notes (Signed)
S: Patient presents today to the [facility].  Patient is currently taking Rebif for multiple sclerosis. Patient is managed by Dr. Marjory Lies for this. Reports that Rebif is working well and he has not had any recent relapses.   Adherence: denies any missed doses. Rotates injection sites.   Dosing: Rebif (subQ): Target dose 44 mcg 3 times weekly (separate doses by at least 48 hours)  Drug-drug interactions: none  Monitoring: CBC: stable LFTs: WNL Thyroid function tests: reports that it has normal in the past. Injection site reactions: denies S/sx of malignancy: denies Neuropsychiatric symptoms: denies Thrombotic microangiopathy (monitor for new onset HTN, thrombocytopenia, or impaired renal dysfunction): denies  O:     Lab Results  Component Value Date   WBC 5.0 12/30/2014   HGB 13.8 12/30/2014   HCT 40.6 12/30/2014   MCV 88 12/30/2014   PLT 211 12/30/2014      Chemistry      Component Value Date/Time   NA 142 12/30/2014 0916   K 4.6 12/30/2014 0916   CL 102 12/30/2014 0916   CO2 25 12/30/2014 0916   BUN 16 12/30/2014 0916   CREATININE 1.05 12/30/2014 0916      Component Value Date/Time   CALCIUM 9.6 12/30/2014 0916   ALKPHOS 61 12/30/2014 0916   AST 27 12/30/2014 0916   ALT 40 12/30/2014 0916   BILITOT 0.6 12/30/2014 0916       A/P: 1. Medication review: Patient is currently on Rebif for MS and is tolerating it well with no adverse effects and no recent relapses. Reviewed the medication with him, including the following: Rebif, interferon beta-1a, is an interferon indicated for the treatment of MS. Analgesics and/or antipyretics may help decrease flu-like symptoms on treatment days. No recommendations for any changes.    Juanita Craver, PharmD, BCPS, CPP Clinical Pharmacist Practitioner  [facility] [phone]  Evaluation and management procedures were performed by the Clinical Pharmacy  Practitioner under my supervision and collaboration. I have reviewed the Practitioner's note and chart, and I agree with the management and plan as documented above.   Jeanann Lewandowsky, MD, MHA, CPE, FACP, FAAP [facility] [place], [place] [phone]   05/11/2016, 2:35 PM

## 2016-06-26 MED FILL — REBIF 44 MCG/0.5ML SOSY: 44 | 28 days supply | Qty: 6 | Fill #0

## 2016-07-27 ENCOUNTER — Ambulatory Visit (INDEPENDENT_AMBULATORY_CARE_PROVIDER_SITE_OTHER): Payer: 59 | Admitting: Diagnostic Neuroimaging

## 2016-07-27 ENCOUNTER — Encounter: Payer: Self-pay | Admitting: Diagnostic Neuroimaging

## 2016-07-27 VITALS — BP 120/70 | HR 75 | Ht 72.0 in | Wt 194.6 lb

## 2016-07-27 DIAGNOSIS — Z5181 Encounter for therapeutic drug level monitoring: Secondary | ICD-10-CM | POA: Diagnosis not present

## 2016-07-27 DIAGNOSIS — G35 Multiple sclerosis: Secondary | ICD-10-CM | POA: Diagnosis not present

## 2016-07-27 DIAGNOSIS — Z79899 Other long term (current) drug therapy: Secondary | ICD-10-CM

## 2016-07-27 NOTE — Patient Instructions (Signed)
Thank you for coming to see [place] at [facility]. I hope we have been able to provide you high quality care today.  You may receive a patient satisfaction survey over the next few weeks. We would appreciate your feedback and comments so that we may continue to improve ourselves and the health of our patients.  - I will check labs today  - continue rebif   ~~~~~~~~~~~~~~~~~~~~~~~~~~~~~~~~~~~~~~~~~~~~~~~~~~~~~~~~~~~~~~~~~  DR. Almee Pelphrey'S GUIDE TO HAPPY AND HEALTHY LIVING These are some of my general health and wellness recommendations. Some of them may apply to you better than others. Please use common sense as you try these suggestions and feel free to ask me any questions.   ACTIVITY/FITNESS Mental, social, emotional and physical stimulation are very important for brain and body health. Try learning a new activity (arts, music, language, sports, games).  Keep moving your body to the best of your abilities. You can do this at home, inside or outside, the park, community center, gym or anywhere you like. Consider a physical therapist or personal trainer to get started. Consider the app Sworkit. Fitness trackers such as smart-watches, smart-phones or Fitbits can help as well.   NUTRITION Eat more plants: colorful vegetables, nuts, seeds and berries.  Eat less sugar, salt, preservatives and processed foods.  Avoid toxins such as cigarettes and alcohol.  Drink water when you are thirsty. Warm water with a slice of lemon is an excellent morning drink to start the day.  Consider these websites for more information The Nutrition Source (https://www.henry-hernandez.biz/) Precision Nutrition (WindowBlog.ch)   RELAXATION Consider practicing mindfulness meditation or other relaxation techniques such as deep breathing, prayer, yoga, tai chi, massage. See website mindful.org or the apps Headspace or Calm to help get  started.   SLEEP Try to get at least 7-8+ hours sleep per day. Regular exercise and reduced caffeine will help you sleep better. Practice good sleep hygeine techniques. See website sleep.org for more information.   PLANNING Prepare estate planning, living will, healthcare POA documents. Sometimes this is best planned with the help of an attorney. Theconversationproject.org and agingwithdignity.org are excellent resources.

## 2016-07-27 NOTE — Progress Notes (Signed)
[facility]  PATIENT: Bradley DibbleOrlando L Purkey DOB: [birth year]  REFERRING CLINICIAN:  HISTORY FROM: patient and wife REASON FOR VISIT: follow up (former Dr. Sandria ManlyLove patient)   HISTORICAL  CHIEF COMPLAINT:  Chief Complaint  Patient presents with  . Follow-up    RM 6 with Vernona RiegerLaura. Rebif going well no problems. Doing well. No new sx.     HISTORY OF PRESENT ILLNESS:   UPDATE 07/27/16: Since last visit, doing well. Tolerating rebif. No skin site reactions.  UPDATE 07/22/15: Doing well. No new neurologic symptoms. Tolerating rebif. MRI scans and labs are stable.  UPDATE 12/30/14: Since last visit, doing about the same. No new neuro symptoms. Some intermittent cramps in hands. Tolerating rebif.  UPDATE 12/30/13: Since last visit patient doing well. No further neurologic symptoms. Patient is tolerating Rebif injections. No skin site reactions. I reviewed patient's initial symptoms in August 2006. Patient tells me that 2 weeks prior to onset of double vision 2006, patient had had palpitations, diagnosed with Wolff-Parkinson-White syndrome, and underwent cardiac catheterization with radiofrequency ablation. When patient developed double vision, he had woke up with symptoms. Symptoms lasted approximately 2 days.  PRIOR HPI (02/23/12, Dr. Sandria ManlyLove): "55 year old right-handed married African American male first seen 06/30/05 for dizziness and double vision with an abnormal MRI study of the brain  06/27/05 showing a 3-3.5 mm lesion in the brainstem midbrain on the left, adjacent to the aqueduct near the  superior collicus. CBC, CMP, ANA, lipid profile was normal except LDL 155. Examination revealed acuity at 20/20 right eye, 20/30 in the left and red lens showed vertical diplopia. LP 08/31/05 showed negative ACE  and VDRL with glucose 54, protein 33, no red blood cells, one white blood cell, and positive CSF oligoclonal banding with normal CSF IgG index. He began rebif on Mondays, Wednesdays, and Fridays  12/06. He has not had significant side effects from the medication. He denies bowel or bladder incontinence. He has no history of Lhermitte's sign. His memory has been good. He has no injection site sores. His last blood studies 06/03/10 were normal CBC and CMP. MRIs of the brain and cervical spine 09/14/2010 without and with contrast enhancement were normal except for degenerative changes at C4-5. 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 He exercises on a regular basis."    REVIEW OF SYSTEMS: Full 14 system review of systems performed and notable only for as per history of present illness. Otherwise negative.  ALLERGIES: No Known Allergies  HOME MEDICATIONS: Outpatient Medications Prior to Visit  Medication Sig Dispense Refill  . Cholecalciferol (VITAMIN D) 2000 units tablet Take 2,000 Units by mouth daily.    . interferon beta-1a (REBIF) 44 MCG/0.5ML SOSY injection Inject 0.5 mLs (44 mcg total) into the skin 3 (three) times a week. 12 Syringe 1   No facility-administered medications prior to visit.     PAST MEDICAL HISTORY: Past Medical History:  Diagnosis Date  . Multiple sclerosis (HCC)     PAST SURGICAL HISTORY: Past Surgical History:  Procedure Laterality Date  . CARDIAC CATHETERIZATION  2006    FAMILY HISTORY: Family History  Problem Relation Age of Onset  . Healthy Mother   . Healthy Father     SOCIAL HISTORY:  Social History   Social History  . Marital status: Married    Spouse name: N/A  . Number of children: 1  . Years of education: N/A   Occupational History  .  [employer]   Social  History Main Topics  . Smoking status: Never Smoker  . Smokeless tobacco: Never Used  . Alcohol use Yes     Comment: 1 beverage  per week  . Drug use: No  . Sexual activity: Not on file   Other Topics Concern  . Not on file   Social History Narrative   Patient lives at home with his spouse.   Caffeine Use: does consume     PHYSICAL  EXAM  Vitals:   07/27/16 1535  BP: 120/70  BP Location: Left Arm  Patient Position: Sitting  Cuff Size: Normal  Pulse: 75  Weight: 194 lb 9.6 oz (88.3 kg)  Height: 6' (1.829 m)    Not recorded      Body mass index is 26.39 kg/m.  GENERAL EXAM: Patient is in no distress; well developed, nourished and groomed; neck is supple  CARDIOVASCULAR: Regular rate and rhythm, no murmurs, no carotid bruits  NEUROLOGIC: MENTAL STATUS: awake, alert, language fluent, comprehension intact, naming intact, fund of knowledge appropriate CRANIAL NERVE: pupils equal and reactive to light, visual fields full to confrontation, extraocular muscles intact, MILD END GAZE NYSTAGMUS, facial sensation and strength symmetric, hearing intact, palate elevates symmetrically, uvula midline, shoulder shrug symmetric, tongue midline. MOTOR: normal bulk and tone, full strength in the BUE, BLE SENSORY: normal and symmetric to light touch, temperature, vibration COORDINATION: finger-nose-finger, fine finger movements normal REFLEXES: deep tendon reflexes present and symmetric; BRISK AT KNEES; ANKLES 1. GAIT/STATION: narrow based gait    DIAGNOSTIC DATA (LABS, IMAGING, TESTING) - I reviewed patient records, labs, notes, testing and imaging myself where available.   Lab Results  Component Value Date   WBC 5.0 12/30/2014   HGB 13.8 12/30/2014   HCT 40.6 12/30/2014   MCV 88 12/30/2014   PLT 211 12/30/2014   CMP Latest Ref Rng & Units 12/30/2014  Glucose 65 - 99 mg/dL 92  BUN 6 - 24 mg/dL 16  Creatinine 1.61 - 0.96 mg/dL 0.45  Sodium 409 - 811 mmol/L 142  Potassium 3.5 - 5.2 mmol/L 4.6  Chloride 97 - 108 mmol/L 102  CO2 18 - 29 mmol/L 25  Calcium 8.7 - 10.2 mg/dL 9.6  Total Protein 6.0 - 8.5 g/dL 7.0  Total Bilirubin 0.0 - 1.2 mg/dL 0.6  Alkaline Phos 39 - 117 IU/L 61  AST 0 - 40 IU/L 27  ALT 0 - 44 IU/L 40   Vit D, 25-Hydroxy  Date Value Ref Range Status  07/22/2015 12.3 (L) 30.0 - 100.0 ng/mL  Final    Comment:    Vitamin D deficiency has been defined by the Institute of Medicine and an Endocrine Society practice guideline as a level of serum 25-OH vitamin D less than 20 ng/mL (1,2). The Endocrine Society went on to further define vitamin D insufficiency as a level between 21 and 29 ng/mL (2). 1. IOM (Institute of Medicine). 2010. Dietary reference    intakes for calcium and D. [place]: The    [employer]. 2. Holick MF, Binkley [place], Bischoff-Ferrari HA, et al.    Evaluation, treatment, and prevention of vitamin D    deficiency: an Endocrine Society clinical practice    guideline. JCEM. 2011 Jul; 96(7):1911-30.    No results found for: CHOL No results found for: HGBA1C No results found for: VITAMINB12 No results found for: TSH    09/15/10 MRI brain - (report states normal brain); However I reviewed images myself and there is a subtle T2 hyperintense lesion in the  midbrain on the left adjacent to the aqueduct, near the superior colliculus and fourth cranial nerve nucleus. Stable from prior studies. No abnl enhancement. -VRP  09/15/10 MRI cervical spine: 1. Stable, normal MRI appearance of the cervical and visualized thoracic spinal cord.  2. No acute findings in the cervical spine. Cervical degenerative changes, maximal at C4-C5.  01/06/15 MRI brain (with and without) demonstrating: 1. Subtle, stable T2 hyperintense lesion near the peri-aqueductal gray on the left side. No abnormal lesions are seen on post contrast views. May represent chronic demyelinating plaque or other non-specific autoimmune/inflammatory etiology. 2. No significant change from MRI on 09/14/10.   01/06/15 MRI cervical spine (with and without) demonstrating: 1. At C4-5: disc bulging and uncovertebral joint hypertrophy with moderate biforaminal stenosis. 2. No intrinsic or abnormal enhancing spinal cord lesions. 3. No significant change from MRI on 09/14/10.     ASSESSMENT AND PLAN  55  y.o. year old male here with multiple sclerosis since 2006. Symptoms stable on rebif. Will check labs.    Dx:  MS (multiple sclerosis) (HCC) - Plan: CBC with Differential/Platelet, CMP, VITAMIN D 25 Hydroxy (Vit-D Deficiency, Fractures)  Encounter for monitoring immunomodulating therapy     PLAN: - continue rebif and vitamin D supplement - check vitamin D levels and labs  Orders Placed This Encounter  Procedures  . CBC with Differential/Platelet  . CMP  . VITAMIN D 25 Hydroxy (Vit-D Deficiency, Fractures)   Return in about 1 year (around 07/27/2017).    Suanne Marker, MD 07/27/2016, 4:14 PM Certified in Neurology, Neurophysiology and Neuroimaging  [facility] [address] [place], [place] [address] [phone]

## 2016-07-28 LAB — COMPREHENSIVE METABOLIC PANEL
ALT: 38 IU/L (ref 0–44)
AST: 32 IU/L (ref 0–40)
Albumin/Globulin Ratio: 1.6 (ref 1.2–2.2)
Albumin: 4.3 g/dL (ref 3.5–5.5)
Alkaline Phosphatase: 49 IU/L (ref 39–117)
BUN/Creatinine Ratio: 12 (ref 9–20)
BUN: 12 mg/dL (ref 6–24)
Bilirubin Total: 0.8 mg/dL (ref 0.0–1.2)
CO2: 27 mmol/L (ref 18–29)
Calcium: 9.6 mg/dL (ref 8.7–10.2)
Chloride: 103 mmol/L (ref 96–106)
Creatinine, Ser: 1.03 mg/dL (ref 0.76–1.27)
GFR calc Af Amer: 94 mL/min/{1.73_m2} (ref >59)
GFR calc non Af Amer: 81 mL/min/{1.73_m2} (ref >59)
Globulin, Total: 2.7 g/dL (ref 1.5–4.5)
Glucose: 105 mg/dL — ABNORMAL HIGH (ref 65–99)
Potassium: 3.8 mmol/L (ref 3.5–5.2)
Sodium: 143 mmol/L (ref 134–144)
Total Protein: 7 g/dL (ref 6.0–8.5)

## 2016-07-28 LAB — CBC WITH DIFFERENTIAL/PLATELET
Basophils Absolute: 0 10*3/uL (ref 0.0–0.2)
Basos: 0 %
EOS (ABSOLUTE): 0.1 10*3/uL (ref 0.0–0.4)
Eos: 1 %
Hematocrit: 37.8 % (ref 37.5–51.0)
Hemoglobin: 12.7 g/dL (ref 12.6–17.7)
Immature Grans (Abs): 0 10*3/uL (ref 0.0–0.1)
Immature Granulocytes: 0 %
Lymphocytes Absolute: 1.5 10*3/uL (ref 0.7–3.1)
Lymphs: 30 %
MCH: 30.1 pg (ref 26.6–33.0)
MCHC: 33.6 g/dL (ref 31.5–35.7)
MCV: 90 fL (ref 79–97)
Monocytes Absolute: 0.5 10*3/uL (ref 0.1–0.9)
Monocytes: 10 %
Neutrophils Absolute: 2.9 10*3/uL (ref 1.4–7.0)
Neutrophils: 59 %
Platelets: 227 10*3/uL (ref 150–379)
RBC: 4.22 x10E6/uL (ref 4.14–5.80)
RDW: 14.3 % (ref 12.3–15.4)
WBC: 5 10*3/uL (ref 3.4–10.8)

## 2016-07-28 LAB — VITAMIN D 25 HYDROXY (VIT D DEFICIENCY, FRACTURES): Vit D, 25-Hydroxy: 24 ng/mL — ABNORMAL LOW (ref 30.0–100.0)

## 2016-08-09 MED FILL — REBIF 44 MCG/0.5ML SOSY: 44 | 28 days supply | Qty: 6 | Fill #1

## 2016-09-20 ENCOUNTER — Other Ambulatory Visit: Payer: Self-pay | Admitting: Internal Medicine

## 2016-09-20 ENCOUNTER — Other Ambulatory Visit: Payer: Self-pay | Admitting: Diagnostic Neuroimaging

## 2016-09-20 ENCOUNTER — Other Ambulatory Visit: Payer: Self-pay | Admitting: Pharmacist

## 2016-09-20 MED ORDER — INTERFERON BETA-1A 44 MCG/0.5ML ~~LOC~~ SOSY: PREFILLED_SYRINGE | SUBCUTANEOUS | 6 refills | Status: DC

## 2016-09-20 MED FILL — REBIF 44 MCG/0.5ML SOSY: 44 | 28 days supply | Qty: 6 | Fill #0

## 2016-11-14 MED FILL — REBIF 44 MCG/0.5ML SOSY: 44 | 28 days supply | Qty: 6 | Fill #1

## 2016-12-21 MED FILL — REBIF 44 MCG/0.5ML SOSY: 44 | 28 days supply | Qty: 6 | Fill #2

## 2017-01-31 MED FILL — REBIF 44 MCG/0.5ML SOSY: 44 | 28 days supply | Qty: 6 | Fill #3

## 2017-02-05 ENCOUNTER — Telehealth: Payer: Self-pay | Admitting: *Deleted

## 2017-02-05 NOTE — Telephone Encounter (Signed)
Rebif approved, #[id number], [employer]. [place] 02/02/2017 to 02/01/2018.

## 2017-03-15 MED FILL — REBIF 44 MCG/0.5ML SOSY: 44 | 28 days supply | Qty: 6 | Fill #4

## 2017-04-18 MED FILL — REBIF 44 MCG/0.5ML SOSY: 44 | 28 days supply | Qty: 6 | Fill #5

## 2017-05-22 MED FILL — REBIF 44 MCG/0.5ML SOSY: 44 | 28 days supply | Qty: 6 | Fill #6

## 2017-07-03 ENCOUNTER — Telehealth: Payer: Self-pay | Admitting: *Deleted

## 2017-07-03 NOTE — Telephone Encounter (Signed)
Rebif Rx faxed to [facility] pharmacy, filled for one year.

## 2017-07-04 ENCOUNTER — Other Ambulatory Visit: Payer: Self-pay | Admitting: Pharmacist

## 2017-07-04 MED ORDER — INTERFERON BETA-1A 44 MCG/0.5ML ~~LOC~~ SOSY: PREFILLED_SYRINGE | SUBCUTANEOUS | 11 refills | Status: DC

## 2017-07-04 MED FILL — REBIF 44 MCG/0.5ML SOSY: 44 | 28 days supply | Qty: 6 | Fill #0

## 2017-07-26 ENCOUNTER — Telehealth: Payer: Self-pay | Admitting: Diagnostic Neuroimaging

## 2017-07-26 NOTE — Telephone Encounter (Signed)
Noted.  Placed message in appt notes as reminder.

## 2017-07-26 NOTE — Telephone Encounter (Signed)
RN Clydie Braun @ Reb[facility] is asking that when pt comes for next appointment could you please provide him with their toll free# [phone] for a courtesy check please.  RN Clydie Braun is not asking for a call back from anyone at [facility] just the pt

## 2017-08-01 ENCOUNTER — Ambulatory Visit (INDEPENDENT_AMBULATORY_CARE_PROVIDER_SITE_OTHER): Payer: 59 | Admitting: Diagnostic Neuroimaging

## 2017-08-01 ENCOUNTER — Encounter: Payer: Self-pay | Admitting: Diagnostic Neuroimaging

## 2017-08-01 VITALS — BP 108/70 | HR 66 | Ht 72.0 in | Wt 195.8 lb

## 2017-08-01 DIAGNOSIS — G35 Multiple sclerosis: Secondary | ICD-10-CM

## 2017-08-01 NOTE — Progress Notes (Signed)
[facility]  PATIENT: Bradley Nicholson DOB: [birth year]  REFERRING CLINICIAN:  HISTORY FROM: patient and wife REASON FOR VISIT: follow up (former Dr. Sandria Manly patient)   HISTORICAL  CHIEF COMPLAINT:  Chief Complaint  Patient presents with  . Follow-up  . Multiple Sclerosis    stabel.     HISTORY OF PRESENT ILLNESS:   UPDATE (08/01/17, VRP): Since last visit, doing well. Tolerating rebif. No alleviating or aggravating factors. No new issues.  UPDATE 07/27/16: Since last visit, doing well. Tolerating rebif. No skin site reactions.  UPDATE 07/22/15: Doing well. No new neurologic symptoms. Tolerating rebif. MRI scans and labs are stable.  UPDATE 12/30/14: Since last visit, doing about the same. No new neuro symptoms. Some intermittent cramps in hands. Tolerating rebif.  UPDATE 12/30/13: Since last visit patient doing well. No further neurologic symptoms. Patient is tolerating Rebif injections. No skin site reactions. I reviewed patient's initial symptoms in August 2006. Patient tells me that 2 weeks prior to onset of double vision 2006, patient had had palpitations, diagnosed with Wolff-Parkinson-White syndrome, and underwent cardiac catheterization with radiofrequency ablation. When patient developed double vision, he had woke up with symptoms. Symptoms lasted approximately 2 days.  PRIOR HPI (02/23/12, Dr. Sandria Manly): "56 year old right-handed married African American male first seen 06/30/05 for dizziness and double vision with an abnormal MRI study of the brain  06/27/05 showing a 3-3.5 mm lesion in the brainstem midbrain on the left, adjacent to the aqueduct near the  superior collicus. CBC, CMP, ANA, lipid profile was normal except LDL 155. Examination revealed acuity at 20/20 right eye, 20/30 in the left and red lens showed vertical diplopia. LP 08/31/05 showed negative ACE  and VDRL with glucose 54, protein 33, no red blood cells, one white blood cell, and positive CSF  oligoclonal banding with normal CSF IgG index. He began rebif on Mondays, Wednesdays, and Fridays 12/06. He has not had significant side effects from the medication. He denies bowel or bladder incontinence. He has no history of Lhermitte's sign. His memory has been good. He has no injection site sores. His last blood studies 06/03/10 were normal CBC and CMP. MRIs of the brain and cervical spine 09/14/2010 without and with contrast enhancement were normal except for degenerative changes at C4-5. 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 He exercises on a regular basis."    REVIEW OF SYSTEMS: Full 14 system review of systems performed and negative except: only for as per history of present illness.   ALLERGIES: No Known Allergies  HOME MEDICATIONS: Outpatient Medications Prior to Visit  Medication Sig Dispense Refill  . Cholecalciferol (VITAMIN D) 2000 units tablet Take 2,000 Units by mouth daily.    . interferon beta-1a (REBIF) 44 MCG/0.5ML SOSY injection INJECT 0.5 MLS INTO THE SKIN 3 TIMES A WEEK. 12 mL 11   No facility-administered medications prior to visit.     PAST MEDICAL HISTORY: Past Medical History:  Diagnosis Date  . Multiple sclerosis (HCC)     PAST SURGICAL HISTORY: Past Surgical History:  Procedure Laterality Date  . CARDIAC CATHETERIZATION  2006    FAMILY HISTORY: Family History  Problem Relation Age of Onset  . Healthy Mother   . Healthy Father     SOCIAL HISTORY:  Social History   Social History  . Marital status: Married    Spouse name: N/A  . Number of children: 1  . Years of education: N/A   Occupational History  .  [employer]   Social History Main Topics  . Smoking status: Never Smoker  . Smokeless tobacco: Never Used  . Alcohol use Yes     Comment: 1 beverage  per week  . Drug use: No  . Sexual activity: Not on file   Other Topics Concern  . Not on file   Social History Narrative   Patient lives at home with his  spouse.   Caffeine Use: does consume     PHYSICAL EXAM  Vitals:   08/01/17 1453  BP: 108/70  Pulse: 66  Weight: 195 lb 12.8 oz (88.8 kg)  Height: 6' (1.829 m)    Not recorded      Body mass index is 26.56 kg/m.  GENERAL EXAM: Patient is in no distress; well developed, nourished and groomed; neck is supple  CARDIOVASCULAR: Regular rate and rhythm, no murmurs, no carotid bruits  NEUROLOGIC: MENTAL STATUS: awake, alert, language fluent, comprehension intact, naming intact, fund of knowledge appropriate CRANIAL NERVE: pupils equal and reactive to light, visual fields full to confrontation, extraocular muscles intact, MILD END GAZE NYSTAGMUS, facial sensation and strength symmetric, hearing intact, palate elevates symmetrically, uvula midline, shoulder shrug symmetric, tongue midline. MOTOR: normal bulk and tone, full strength in the BUE, BLE SENSORY: normal and symmetric to light touch, temperature, vibration COORDINATION: finger-nose-finger, fine finger movements normal REFLEXES: deep tendon reflexes present and symmetric GAIT/STATION: narrow based gait    DIAGNOSTIC DATA (LABS, IMAGING, TESTING) - I reviewed patient records, labs, notes, testing and imaging myself where available.   Lab Results  Component Value Date   WBC 5.0 07/27/2016   HGB 12.7 07/27/2016   HCT 37.8 07/27/2016   MCV 90 07/27/2016   PLT 227 07/27/2016   CMP Latest Ref Rng & Units 07/27/2016 12/30/2014  Glucose 65 - 99 mg/dL 161(W) 92  BUN 6 - 24 mg/dL 12 16  Creatinine 9.60 - 1.27 mg/dL 4.54 0.98  Sodium 119 - 144 mmol/L 143 142  Potassium 3.5 - 5.2 mmol/L 3.8 4.6  Chloride 96 - 106 mmol/L 103 102  CO2 18 - 29 mmol/L 27 25  Calcium 8.7 - 10.2 mg/dL 9.6 9.6  Total Protein 6.0 - 8.5 g/dL 7.0 7.0  Total Bilirubin 0.0 - 1.2 mg/dL 0.8 0.6  Alkaline Phos 39 - 117 IU/L 49 61  AST 0 - 40 IU/L 32 27  ALT 0 - 44 IU/L 38 40   Vit D, 25-Hydroxy  Date Value Ref Range Status  07/27/2016 24.0 (L)  30.0 - 100.0 ng/mL Final    Comment:    Vitamin D deficiency has been defined by the Institute of Medicine and an Endocrine Society practice guideline as a level of serum 25-OH vitamin D less than 20 ng/mL (1,2). The Endocrine Society went on to further define vitamin D insufficiency as a level between 21 and 29 ng/mL (2). 1. IOM (Institute of Medicine). 2010. Dietary reference    intakes for calcium and D. [place]: The    [employer]. 2. Holick MF, Binkley Wolsey, Bischoff-Ferrari HA, et al.    Evaluation, treatment, and prevention of vitamin D    deficiency: an Endocrine Society clinical practice    guideline. JCEM. 2011 Jul; 96(7):1911-30.   07/22/2015 12.3 (L) 30.0 - 100.0 ng/mL Final    Comment:    Vitamin D deficiency has been defined by the Institute of Medicine and an Endocrine Society practice guideline as a level of serum 25-OH vitamin D less than 20 ng/mL (1,2). The  Endocrine Society went on to further define vitamin D insufficiency as a level between 21 and 29 ng/mL (2). 1. IOM (Institute of Medicine). 2010. Dietary reference    intakes for calcium and D. [place]: The    [employer]. 2. Holick MF, Binkley [place], Bischoff-Ferrari HA, et al.    Evaluation, treatment, and prevention of vitamin D    deficiency: an Endocrine Society clinical practice    guideline. JCEM. 2011 Jul; 96(7):1911-30.    No results found for: CHOL No results found for: HGBA1C No results found for: VITAMINB12 No results found for: TSH    09/15/10 MRI brain - (report states normal brain); However I reviewed images myself and there is a subtle T2 hyperintense lesion in the midbrain on the left adjacent to the aqueduct, near the superior colliculus and fourth cranial nerve nucleus. Stable from prior studies. No abnl enhancement. -VRP  09/15/10 MRI cervical spine: 1. Stable, normal MRI appearance of the cervical and visualized thoracic spinal cord.  2. No acute  findings in the cervical spine. Cervical degenerative changes, maximal at C4-C5.  01/06/15 MRI brain (with and without) demonstrating: 1. Subtle, stable T2 hyperintense lesion near the peri-aqueductal gray on the left side. No abnormal lesions are seen on post contrast views. May represent chronic demyelinating plaque or other non-specific autoimmune/inflammatory etiology. 2. No significant change from MRI on 09/14/10.   01/06/15 MRI cervical spine (with and without) demonstrating: 1. At C4-5: disc bulging and uncovertebral joint hypertrophy with moderate biforaminal stenosis. 2. No intrinsic or abnormal enhancing spinal cord lesions. 3. No significant change from MRI on 09/14/10.     ASSESSMENT AND PLAN  56 y.o. year old male here with multiple sclerosis since 2006. Symptoms stable on rebif. Will check labs.    Dx:  MS (multiple sclerosis) (HCC) - Plan: CBC with Differential/Platelet, Comprehensive metabolic panel, VITAMIN D 25 Hydroxy (Vit-D Deficiency, Fractures)     PLAN:  I spent 15 minutes of face to face time with patient. Greater than 50% of time was spent in counseling and coordination of care with patient. In summary we discussed:   MULTIPLE SCLEROSIS - continue rebif and vitamin D supplement - check CBC, CMP, vit D levels annually (per PCP)  Orders Placed This Encounter  Procedures  . CBC with Differential/Platelet  . Comprehensive metabolic panel  . VITAMIN D 25 Hydroxy (Vit-D Deficiency, Fractures)   Return in about 1 year (around 08/01/2018).    Suanne Marker, MD 08/01/2017, 3:19 PM Certified in Neurology, Neurophysiology and Neuroimaging  [facility] [address], [address] [place], [place] [address] [phone]

## 2017-08-02 LAB — COMPREHENSIVE METABOLIC PANEL
ALT: 39 IU/L (ref 0–44)
AST: 35 IU/L (ref 0–40)
Albumin/Globulin Ratio: 2.4 — ABNORMAL HIGH (ref 1.2–2.2)
Albumin: 4.7 g/dL (ref 3.5–5.5)
Alkaline Phosphatase: 46 IU/L (ref 39–117)
BUN/Creatinine Ratio: 10 (ref 9–20)
BUN: 11 mg/dL (ref 6–24)
Bilirubin Total: 0.6 mg/dL (ref 0.0–1.2)
CO2: 27 mmol/L (ref 20–29)
Calcium: 9.7 mg/dL (ref 8.7–10.2)
Chloride: 103 mmol/L (ref 96–106)
Creatinine, Ser: 1.09 mg/dL (ref 0.76–1.27)
GFR calc Af Amer: 87 mL/min/{1.73_m2} (ref >59)
GFR calc non Af Amer: 75 mL/min/{1.73_m2} (ref >59)
Globulin, Total: 2 g/dL (ref 1.5–4.5)
Glucose: 73 mg/dL (ref 65–99)
Potassium: 4.1 mmol/L (ref 3.5–5.2)
Sodium: 143 mmol/L (ref 134–144)
Total Protein: 6.7 g/dL (ref 6.0–8.5)

## 2017-08-02 LAB — CBC WITH DIFFERENTIAL/PLATELET
Basophils Absolute: 0 10*3/uL (ref 0.0–0.2)
Basos: 0 %
EOS (ABSOLUTE): 0.1 10*3/uL (ref 0.0–0.4)
Eos: 2 %
Hematocrit: 35.8 % — ABNORMAL LOW (ref 37.5–51.0)
Hemoglobin: 12.6 g/dL — ABNORMAL LOW (ref 13.0–17.7)
Immature Grans (Abs): 0 10*3/uL (ref 0.0–0.1)
Immature Granulocytes: 0 %
Lymphocytes Absolute: 1.5 10*3/uL (ref 0.7–3.1)
Lymphs: 32 %
MCH: 30.2 pg (ref 26.6–33.0)
MCHC: 35.2 g/dL (ref 31.5–35.7)
MCV: 86 fL (ref 79–97)
Monocytes Absolute: 0.5 10*3/uL (ref 0.1–0.9)
Monocytes: 10 %
Neutrophils Absolute: 2.6 10*3/uL (ref 1.4–7.0)
Neutrophils: 56 %
Platelets: 197 10*3/uL (ref 150–379)
RBC: 4.17 x10E6/uL (ref 4.14–5.80)
RDW: 13.7 % (ref 12.3–15.4)
WBC: 4.7 10*3/uL (ref 3.4–10.8)

## 2017-08-02 LAB — VITAMIN D 25 HYDROXY (VIT D DEFICIENCY, FRACTURES): Vit D, 25-Hydroxy: 14.6 ng/mL — ABNORMAL LOW (ref 30.0–100.0)

## 2017-08-06 ENCOUNTER — Telehealth: Payer: Self-pay | Admitting: *Deleted

## 2017-08-06 NOTE — Telephone Encounter (Signed)
-----   Message from Suanne Marker, MD sent at 08/02/2017 12:38 PM EDT ----- Low vitamin D. Ask patient to continue vitamin D 2000 units per day. Other labs unremarkable. Please call patient. -VRP

## 2017-08-06 NOTE — Telephone Encounter (Signed)
LMVM cell (ok per DPR) that lab results unremarkable except for low vitamin D level.  Recommend to continue 2000 units daily per Dr. Marjory Lies.  He is to call back if questions.

## 2017-08-08 MED FILL — REBIF 44 MCG/0.5ML SOSY: 44 | 28 days supply | Qty: 6 | Fill #1

## 2017-10-01 MED FILL — REBIF 44 MCG/0.5ML SOSY: 44 | 28 days supply | Qty: 6 | Fill #2

## 2017-11-05 MED FILL — REBIF 44 MCG/0.5ML SOSY: 44 | 28 days supply | Qty: 6 | Fill #3

## 2017-12-12 MED FILL — REBIF 44 MCG/0.5ML SOSY: 44 | 28 days supply | Qty: 6 | Fill #4

## 2018-01-04 MED FILL — REBIF 44 MCG/0.5ML SOSY: 44 | 28 days supply | Qty: 6 | Fill #5

## 2018-02-14 ENCOUNTER — Telehealth: Payer: Self-pay

## 2018-02-14 NOTE — Telephone Encounter (Signed)
Received notice from [facility] that a PA is needed for pt's rebif, called pharmacy plan at [phone] to complete PA. PA completed over the phone, should have a determination in 72 hours.

## 2018-02-18 MED FILL — REBIF 44 MCG/0.5ML SOSY: 44 | 28 days supply | Qty: 6 | Fill #6

## 2018-02-18 NOTE — Telephone Encounter (Signed)
Received notification from [employer] that pt's rebif syringes have been approved from 02/13/18 to 02/13/2019 for a maximum of 12 fills--12 64mL syringes per 28 days. PA reference #[id number]  [facility] made aware of this approval.

## 2018-04-02 MED FILL — REBIF 44 MCG/0.5ML SOSY: 44 | 28 days supply | Qty: 6 | Fill #7

## 2018-05-04 ENCOUNTER — Ambulatory Visit (HOSPITAL_COMMUNITY)
Admission: EM | Admit: 2018-05-04 | Discharge: 2018-05-04 | Disposition: A | Discharge status: Home / Self Care | Payer: 59 | Attending: Family Medicine | Admitting: Family Medicine

## 2018-05-04 ENCOUNTER — Ambulatory Visit (INDEPENDENT_AMBULATORY_CARE_PROVIDER_SITE_OTHER): Payer: 59

## 2018-05-04 ENCOUNTER — Other Ambulatory Visit: Payer: Self-pay

## 2018-05-04 ENCOUNTER — Encounter (HOSPITAL_COMMUNITY): Payer: Self-pay | Admitting: Emergency Medicine

## 2018-05-04 DIAGNOSIS — W208XXA Other cause of strike by thrown, projected or falling object, initial encounter: Secondary | ICD-10-CM | POA: Diagnosis not present

## 2018-05-04 DIAGNOSIS — S92332A Displaced fracture of third metatarsal bone, left foot, initial encounter for closed fracture: Secondary | ICD-10-CM

## 2018-05-04 MED ORDER — HYDROCODONE-ACETAMINOPHEN 5-325 MG PO TABS: 1.0000 | ORAL_TABLET | Freq: Every evening | ORAL | 0 refills | Status: DC | PRN

## 2018-05-04 NOTE — Discharge Instructions (Addendum)
Expect another 3 weeks for complete healing.  Call orthopedist for opinion on how long to stay off the foot.

## 2018-05-04 NOTE — ED Provider Notes (Signed)
[facility]   [id number] 05/04/18 Arrival Time: 1210   SUBJECTIVE:  Bradley Nicholson is a 57 y.o. male who presents to the urgent care with complaint of left foot pain since two weeks ago.  Pain in top of foot and under ball of foot.  Foot is swollen.  Table fell on the foot.  Past Medical History:  Diagnosis Date  . Multiple sclerosis (HCC)    Family History  Problem Relation Age of Onset  . Healthy Mother   . Healthy Father    Social History   Socioeconomic History  . Marital status: Married    Spouse name: Not on file  . Number of children: 1  . Years of education: Not on file  . Highest education level: Not on file  Occupational History    Employer: [employer]  Social Needs  . Financial resource strain: Not on file  . Food insecurity:    Worry: Not on file    Inability: Not on file  . Transportation needs:    Medical: Not on file    Non-medical: Not on file  Tobacco Use  . Smoking status: Never Smoker  . Smokeless tobacco: Never Used  Substance and Sexual Activity  . Alcohol use: Yes    Comment: 1 beverage  per week  . Drug use: No  . Sexual activity: Not on file  Lifestyle  . Physical activity:    Days per week: Not on file    Minutes per session: Not on file  . Stress: Not on file  Relationships  . Social connections:    Talks on phone: Not on file    Gets together: Not on file    Attends religious service: Not on file    Active member of club or organization: Not on file    Attends meetings of clubs or organizations: Not on file    Relationship status: Not on file  . Intimate partner violence:    Fear of current or ex partner: Not on file    Emotionally abused: Not on file    Physically abused: Not on file    Forced sexual activity: Not on file  Other Topics Concern  . Not on file  Social History Narrative   Patient lives at home with his spouse.   Caffeine Use: does consume   No outpatient medications have been marked as taking  for the 05/04/18 encounter [facility] Encounter).   No Known Allergies    ROS: As per HPI, remainder of ROS negative.   OBJECTIVE:   Vitals:   05/04/18 1249  BP: 115/66  Pulse: (!) 59  Resp: 16  Temp: 98.1 F (36.7 C)  TempSrc: Oral  SpO2: 100%     General appearance: alert; no distress Eyes: PERRL; EOMI; conjunctiva normal HENT: normocephalic; atraumatic; TMs normal, canal normal, external ears normal without trauma; nasal mucosa normal; oral mucosa normal Neck: supple Lungs: clear to auscultation bilaterally Heart: regular rate and rhythm Abdomen: soft, non-tender; bowel sounds normal; no masses or organomegaly; no guarding or rebound tenderness Back: no CVA tenderness Extremities: no cyanosis or edema; moderate dorsal left foot swelling and mild tenderness at the proximal end of middle metatarsals. Skin: warm and dry Neurologic: slight antalgic gait; grossly normal Psychological: alert and cooperative; normal mood and affect      Labs:  Results for orders placed or performed in visit on 08/01/17  CBC with Differential/Platelet  Result Value Ref Range   WBC 4.7 3.4 - 10.8  x10E3/uL   RBC 4.17 4.14 - 5.80 x10E6/uL   Hemoglobin 12.6 (L) 13.0 - 17.7 g/dL   Hematocrit 33.2 (L) 95.1 - 51.0 %   MCV 86 79 - 97 fL   MCH 30.2 26.6 - 33.0 pg   MCHC 35.2 31.5 - 35.7 g/dL   RDW 88.4 16.6 - 06.3 %   Platelets 197 150 - 379 x10E3/uL   Neutrophils 56 Not Estab. %   Lymphs 32 Not Estab. %   Monocytes 10 Not Estab. %   Eos 2 Not Estab. %   Basos 0 Not Estab. %   Neutrophils Absolute 2.6 1.4 - 7.0 x10E3/uL   Lymphocytes Absolute 1.5 0.7 - 3.1 x10E3/uL   Monocytes Absolute 0.5 0.1 - 0.9 x10E3/uL   EOS (ABSOLUTE) 0.1 0.0 - 0.4 x10E3/uL   Basophils Absolute 0.0 0.0 - 0.2 x10E3/uL   Immature Granulocytes 0 Not Estab. %   Immature Grans (Abs) 0.0 0.0 - 0.1 x10E3/uL  Comprehensive metabolic panel  Result Value Ref Range   Glucose 73 65 - 99 mg/dL   BUN 11 6 - 24 mg/dL    Creatinine, Ser 0.16 0.76 - 1.27 mg/dL   GFR calc non Af Amer 75 >59 mL/min/1.73   GFR calc Af Amer 87 >59 mL/min/1.73   BUN/Creatinine Ratio 10 9 - 20   Sodium 143 134 - 144 mmol/L   Potassium 4.1 3.5 - 5.2 mmol/L   Chloride 103 96 - 106 mmol/L   CO2 27 20 - 29 mmol/L   Calcium 9.7 8.7 - 10.2 mg/dL   Total Protein 6.7 6.0 - 8.5 g/dL   Albumin 4.7 3.5 - 5.5 g/dL   Globulin, Total 2.0 1.5 - 4.5 g/dL   Albumin/Globulin Ratio 2.4 (H) 1.2 - 2.2   Bilirubin Total 0.6 0.0 - 1.2 mg/dL   Alkaline Phosphatase 46 39 - 117 IU/L   AST 35 0 - 40 IU/L   ALT 39 0 - 44 IU/L  VITAMIN D 25 Hydroxy (Vit-D Deficiency, Fractures)  Result Value Ref Range   Vit D, 25-Hydroxy 14.6 (L) 30.0 - 100.0 ng/mL    Labs Reviewed - No data to display  No results found.  X-ray:  Displaced fx third metatarsal of left foot with callus formation   ASSESSMENT & PLAN:  1. Closed displaced fracture of third metatarsal bone of left foot, initial encounter   referred to ortho  Meds ordered this encounter  Medications  . HYDROcodone-acetaminophen (NORCO) 5-325 MG tablet    Sig: Take 1 tablet by mouth at bedtime as needed and may repeat dose one time if needed for moderate pain.    Dispense:  20 tablet    Refill:  0    Reviewed expectations re: course of current medical issues. Questions answered. Outlined signs and symptoms indicating need for more acute intervention. Patient verbalized understanding. After Visit Summary given.    Procedures:      Elvina Sidle, MD 05/04/18 1323

## 2018-05-04 NOTE — ED Triage Notes (Signed)
Table fell on left foot two weeks ago.  Pain in top of foot and under ball of foot.  Foot is swollen

## 2018-05-06 DIAGNOSIS — S92354A Nondisplaced fracture of fifth metatarsal bone, right foot, initial encounter for closed fracture: Secondary | ICD-10-CM | POA: Diagnosis not present

## 2018-05-14 MED FILL — REBIF 44 MCG/0.5ML SOSY: 44 | 28 days supply | Qty: 6 | Fill #8

## 2018-05-27 DIAGNOSIS — S92354D Nondisplaced fracture of fifth metatarsal bone, right foot, subsequent encounter for fracture with routine healing: Secondary | ICD-10-CM | POA: Diagnosis not present

## 2018-06-19 MED FILL — REBIF 44 MCG/0.5ML SOSY: 44 | 28 days supply | Qty: 6 | Fill #9

## 2018-06-24 DIAGNOSIS — S92354D Nondisplaced fracture of fifth metatarsal bone, right foot, subsequent encounter for fracture with routine healing: Secondary | ICD-10-CM | POA: Diagnosis not present

## 2018-08-05 ENCOUNTER — Ambulatory Visit: Payer: 59 | Admitting: Diagnostic Neuroimaging

## 2018-08-06 ENCOUNTER — Encounter: Payer: Self-pay | Admitting: Diagnostic Neuroimaging

## 2018-08-12 ENCOUNTER — Other Ambulatory Visit: Payer: Self-pay | Admitting: Pharmacist

## 2018-08-12 ENCOUNTER — Other Ambulatory Visit: Payer: Self-pay | Admitting: Diagnostic Neuroimaging

## 2018-08-12 MED ORDER — INTERFERON BETA-1A 44 MCG/0.5ML ~~LOC~~ SOSY: 44.0000 ug | PREFILLED_SYRINGE | SUBCUTANEOUS | 11 refills | Status: DC

## 2018-08-12 MED FILL — REBIF 44 MCG/0.5ML SOSY: 44 | 28 days supply | Qty: 6 | Fill #0

## 2018-08-20 ENCOUNTER — Telehealth: Payer: Self-pay | Admitting: Pharmacist

## 2018-08-20 NOTE — Telephone Encounter (Signed)
Called patient to schedule an appointment for the [facility]. I was unable to reach the patient so I left a HIPAA-compliant message requesting that the patient return my call.   

## 2018-09-11 NOTE — Telephone Encounter (Signed)
Called patient to schedule an appointment for the [facility]. I was unable to reach the patient so I left a HIPAA-compliant message requesting that the patient return my call.   

## 2018-09-26 MED FILL — REBIF 44 MCG/0.5ML SOSY: 44 | 28 days supply | Qty: 6 | Fill #1

## 2018-10-08 ENCOUNTER — Ambulatory Visit (INDEPENDENT_AMBULATORY_CARE_PROVIDER_SITE_OTHER): Payer: 59 | Admitting: Pharmacist

## 2018-10-08 ENCOUNTER — Encounter: Payer: Self-pay | Admitting: Pharmacist

## 2018-10-08 DIAGNOSIS — Z79899 Other long term (current) drug therapy: Secondary | ICD-10-CM

## 2018-10-08 NOTE — Progress Notes (Signed)
   S: [facility] of their specialty medication therapy.  Patient is currently taking Rebif for multiple sclerosis (MS). Patient is managed by Dr. Marjory Lies for this.   Adherence: denies any missed doses  Efficacy:  Dosing: Rebif (subQ): 44 mcg 3 times weekly (separate doses by at least 48 hours)   Drug-drug interactions: none  Monitoring: CBC: last one Sept 2018, WNL, will get new one at neurology visit in January LFTs: same as CBC Thyroid function tests: no recent tests on file Injection site reactions: none S/sx of malignancy: none Neuropsychiatric symptoms: none Thrombotic microangiopathy (monitor for new onset HTN, thrombocytopenia, or impaired renal dysfunction): none   O:     Lab Results  Component Value Date   WBC 4.7 08/01/2017   HGB 12.6 (L) 08/01/2017   HCT 35.8 (L) 08/01/2017   MCV 86 08/01/2017   PLT 197 08/01/2017      Chemistry      Component Value Date/Time   NA 143 08/01/2017 1543   K 4.1 08/01/2017 1543   CL 103 08/01/2017 1543   CO2 27 08/01/2017 1543   BUN 11 08/01/2017 1543   CREATININE 1.09 08/01/2017 1543      Component Value Date/Time   CALCIUM 9.7 08/01/2017 1543   ALKPHOS 46 08/01/2017 1543   AST 35 08/01/2017 1543   ALT 39 08/01/2017 1543   BILITOT 0.6 08/01/2017 1543       No results found for: TSH   A/P: 1. Medication review: Patient currently on Rebif for the treatment of MS and is tolerating it well with no changes since last visit. Reviewed the medication with the patient, including the following: Rebif, interferon beta-1a, is an interferon indicated for the treatment of MS. Patient educated on purpose, proper use and potential adverse effects of Rebif. Analgesics and/or antipyretics may help decrease flu-like symptoms on treatment days. No recommendations for any changes.    Alvino Blood, PharmD, BCPS, BCACP, CPP Clinical Pharmacist Practitioner  [phone]

## 2018-10-27 IMAGING — DX DG FOOT COMPLETE 3+V*L*
3 series · 3 of 3 positions shown · non-contrast
Comparison: None.

CLINICAL DATA: 56-year-old male with left foot pain after a table
fell on his foot 2 weeks ago.

EXAM:
LEFT FOOT - COMPLETE 3+ VIEW

[foot ap]
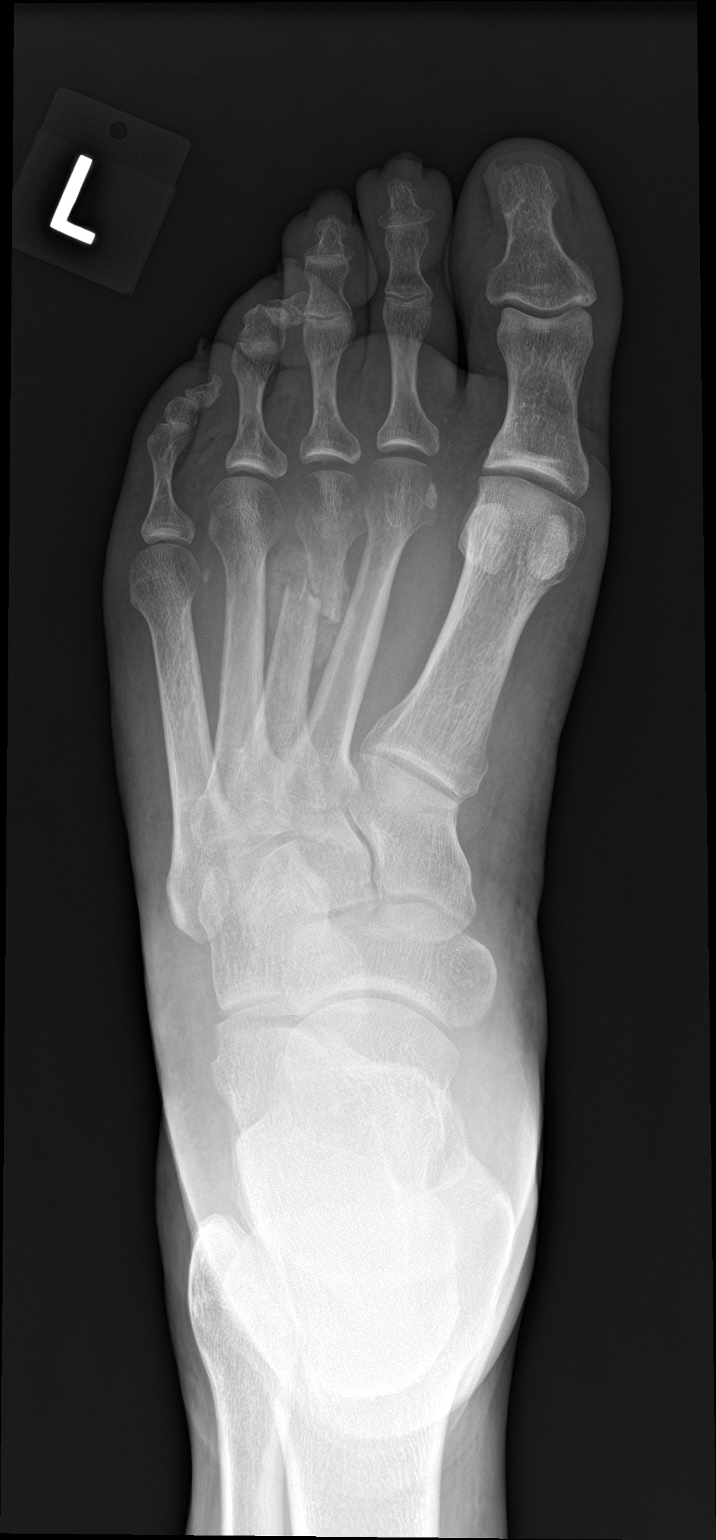

[foot obl]
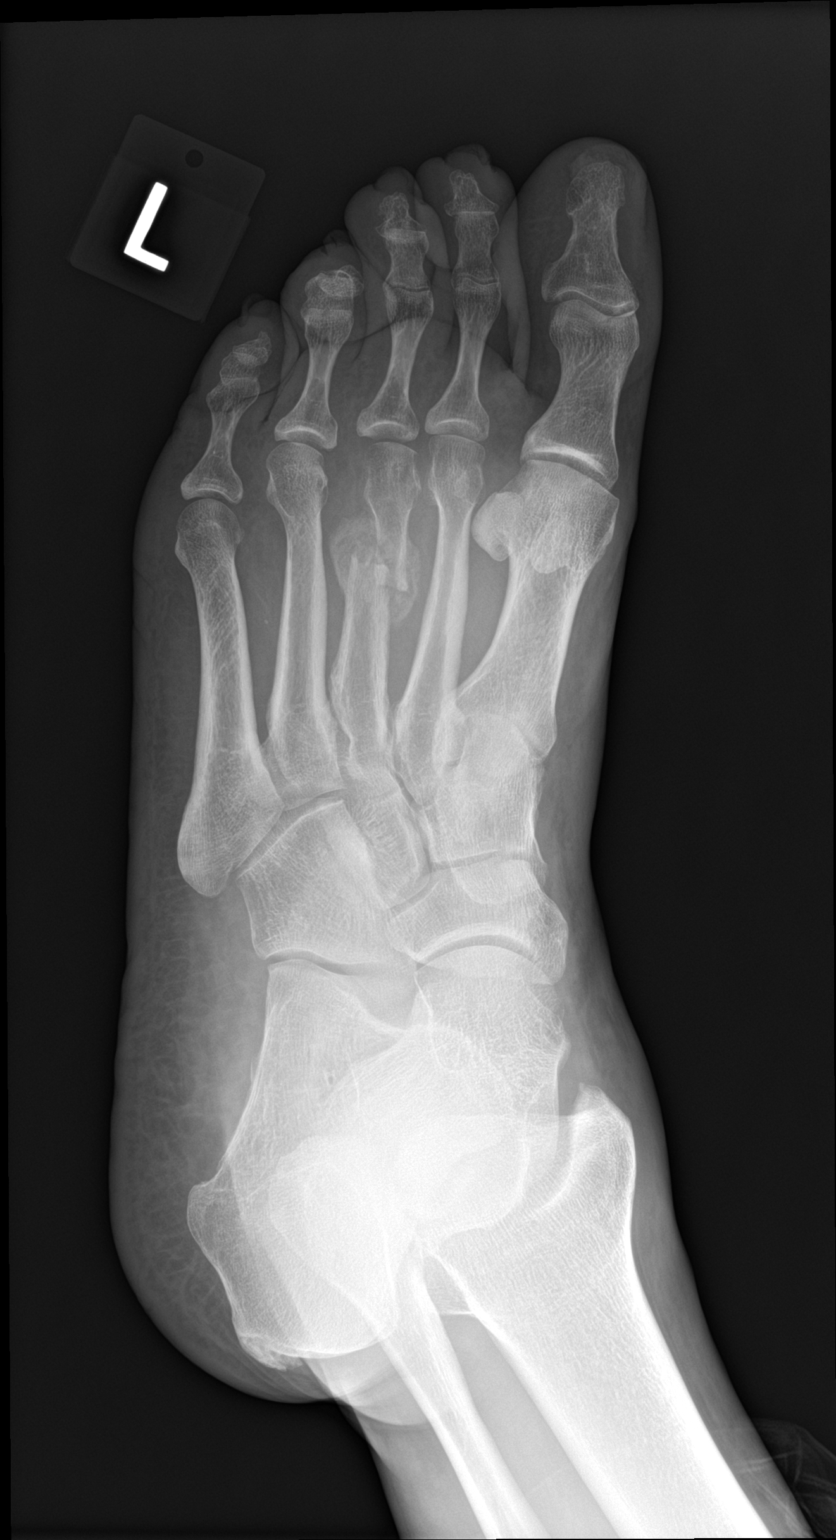

[foot lat]
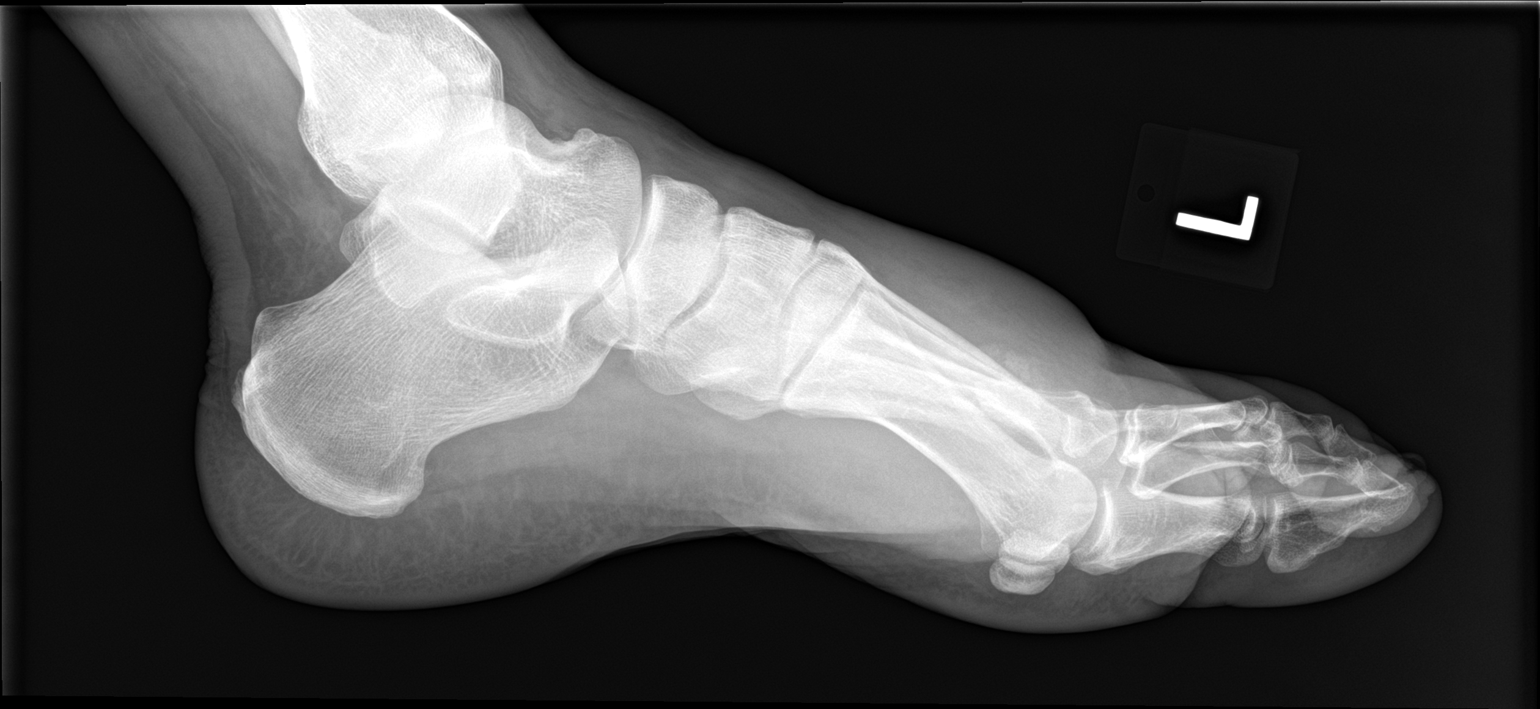

[3 of 3 positions shown; findings below may reference images not displayed]

FINDINGS: Displaced fracture through the mid aspect of the third metatarsal.
There is surrounding callus formation consistent with a healing
fracture. The distal fracture fragment is displaced medially by
approximately 5 mm. There is associated soft tissue swelling,
particularly along the dorsal aspect of the foot. The remaining
visualized bones and joints are intact and unremarkable.
IMPRESSION: 1. Displaced fracture through the mid aspect of the third
metatarsal. Surrounding callus formation suggests healing and a
subacute time frame consistent with the patient's clinical history
of injury 2 weeks previously.

## 2018-11-11 MED FILL — REBIF 44 MCG/0.5ML SOSY: 44 | 28 days supply | Qty: 6 | Fill #2

## 2018-12-02 ENCOUNTER — Encounter: Payer: Self-pay | Admitting: Diagnostic Neuroimaging

## 2018-12-02 ENCOUNTER — Ambulatory Visit: Payer: 59 | Admitting: Diagnostic Neuroimaging

## 2018-12-02 VITALS — BP 118/71 | HR 70 | Ht 72.0 in | Wt 202.6 lb

## 2018-12-02 DIAGNOSIS — G35 Multiple sclerosis: Secondary | ICD-10-CM

## 2018-12-02 NOTE — Progress Notes (Signed)
[facility]  PATIENT: Bradley Nicholson DOB: 27-Sep-1961  REFERRING CLINICIAN:  HISTORY FROM: patient and wife REASON FOR VISIT: follow up (former Dr. Sandria Manly patient)   HISTORICAL  CHIEF COMPLAINT:  Chief Complaint  Patient presents with  . Follow-up    Rm 7, alone  . Multiple Sclerosis    doing well.      HISTORY OF PRESENT ILLNESS:   UPDATE (12/02/18, VRP): Since last visit, doing well. Symptoms are stable. No alleviating or aggravating factors. Tolerating rebif.    UPDATE (08/01/17, VRP): Since last visit, doing well. Tolerating rebif. No alleviating or aggravating factors. No new issues.  UPDATE 07/27/16: Since last visit, doing well. Tolerating rebif. No skin site reactions.  UPDATE 07/22/15: Doing well. No new neurologic symptoms. Tolerating rebif. MRI scans and labs are stable.  UPDATE 12/30/14: Since last visit, doing about the same. No new neuro symptoms. Some intermittent cramps in hands. Tolerating rebif.  UPDATE 12/30/13: Since last visit patient doing well. No further neurologic symptoms. Patient is tolerating Rebif injections. No skin site reactions. I reviewed patient's initial symptoms in August 2006. Patient tells me that 2 weeks prior to onset of double vision 2006, patient had had palpitations, diagnosed with Wolff-Parkinson-White syndrome, and underwent cardiac catheterization with radiofrequency ablation. When patient developed double vision, he had woke up with symptoms. Symptoms lasted approximately 2 days.  PRIOR HPI (02/23/12, Dr. Sandria Manly): "58 year old right-handed married African American male first seen 06/30/05 for dizziness and double vision with an abnormal MRI study of the brain  06/27/05 showing a 3-3.5 mm lesion in the brainstem midbrain on the left, adjacent to the aqueduct near the  superior collicus. CBC, CMP, ANA, lipid profile was normal except LDL 155. Examination revealed acuity at 20/20 right eye, 20/30 in the left and red lens showed  vertical diplopia. LP 08/31/05 showed negative ACE  and VDRL with glucose 54, protein 33, no red blood cells, one white blood cell, and positive CSF oligoclonal banding with normal CSF IgG index. He began rebif on Mondays, Wednesdays, and Fridays 12/06. He has not had significant side effects from the medication. He denies bowel or bladder incontinence. He has no history of Lhermitte's sign. His memory has been good. He has no injection site sores. His last blood studies 06/03/10 were normal CBC and CMP. MRIs of the brain and cervical spine 09/14/2010 without and with contrast enhancement were normal except for degenerative changes at C4-5. 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 He exercises on a regular basis."    REVIEW OF SYSTEMS: Full 14 system review of systems performed and negative except: only as per HPI.   ALLERGIES: No Known Allergies  HOME MEDICATIONS: Outpatient Medications Prior to Visit  Medication Sig Dispense Refill  . Cholecalciferol (VITAMIN D) 2000 units tablet Take 2,000 Units by mouth daily.    Marland Kitchen HYDROcodone-acetaminophen (NORCO) 5-325 MG tablet Take 1 tablet by mouth at bedtime as needed and may repeat dose one time if needed for moderate pain. 20 tablet 0  . interferon beta-1a (REBIF) 44 MCG/0.5ML SOSY injection Inject 0.5 mLs (44 mcg total) into the skin 3 (three) times a week. 6 mL 11   No facility-administered medications prior to visit.     PAST MEDICAL HISTORY: Past Medical History:  Diagnosis Date  . Multiple sclerosis (HCC)     PAST SURGICAL HISTORY: Past Surgical History:  Procedure Laterality Date  . CARDIAC CATHETERIZATION  2006    FAMILY HISTORY:  Family History  Problem Relation Age of Onset  . Healthy Mother   . Healthy Father     SOCIAL HISTORY:  Social History   Socioeconomic History  . Marital status: Married    Spouse name: Not on file  . Number of children: 1  . Years of education: Not on file  . Highest  education level: Not on file  Occupational History    Employer: [employer]  Social Needs  . Financial resource strain: Not on file  . Food insecurity:    Worry: Not on file    Inability: Not on file  . Transportation needs:    Medical: Not on file    Non-medical: Not on file  Tobacco Use  . Smoking status: Never Smoker  . Smokeless tobacco: Never Used  Substance and Sexual Activity  . Alcohol use: Yes    Comment: 1 beverage  per week  . Drug use: No  . Sexual activity: Not on file  Lifestyle  . Physical activity:    Days per week: Not on file    Minutes per session: Not on file  . Stress: Not on file  Relationships  . Social connections:    Talks on phone: Not on file    Gets together: Not on file    Attends religious service: Not on file    Active member of club or organization: Not on file    Attends meetings of clubs or organizations: Not on file    Relationship status: Not on file  . Intimate partner violence:    Fear of current or ex partner: Not on file    Emotionally abused: Not on file    Physically abused: Not on file    Forced sexual activity: Not on file  Other Topics Concern  . Not on file  Social History Narrative   Patient lives at home with his spouse.   Caffeine Use: does consume     PHYSICAL EXAM  Vitals:   12/02/18 1547  BP: 118/71  Pulse: 70  Weight: 202 lb 9.6 oz (91.9 kg)  Height: 6' (1.829 m)    Not recorded      Body mass index is 27.48 kg/m.  GENERAL EXAM: Patient is in no distress; well developed, nourished and groomed; neck is supple  CARDIOVASCULAR: Regular rate and rhythm, no murmurs, no carotid bruits  NEUROLOGIC: MENTAL STATUS: awake, alert, language fluent, comprehension intact, naming intact, fund of knowledge appropriate CRANIAL NERVE: pupils equal and reactive to light, visual fields full to confrontation, extraocular muscles intact, MILD END GAZE NYSTAGMUS, facial sensation and strength symmetric, hearing intact,  palate elevates symmetrically, uvula midline, shoulder shrug symmetric, tongue midline. MOTOR: normal bulk and tone, full strength in the BUE, BLE SENSORY: normal and symmetric to light touch, temperature, vibration COORDINATION: finger-nose-finger, fine finger movements normal REFLEXES: deep tendon reflexes present and symmetric GAIT/STATION: narrow based gait    DIAGNOSTIC DATA (LABS, IMAGING, TESTING) - I reviewed patient records, labs, notes, testing and imaging myself where available.   Lab Results  Component Value Date   WBC 4.7 08/01/2017   HGB 12.6 (L) 08/01/2017   HCT 35.8 (L) 08/01/2017   MCV 86 08/01/2017   PLT 197 08/01/2017   CMP Latest Ref Rng & Units 08/01/2017 07/27/2016 12/30/2014  Glucose 65 - 99 mg/dL 73 270(J) 92  BUN 6 - 24 mg/dL 11 12 16   Creatinine 0.76 - 1.27 mg/dL 5.00 9.38 1.82  Sodium 134 - 144 mmol/L 143 143 142  Potassium 3.5 - 5.2 mmol/L 4.1 3.8 4.6  Chloride 96 - 106 mmol/L 103 103 102  CO2 20 - 29 mmol/L 27 27 25   Calcium 8.7 - 10.2 mg/dL 9.7 9.6 9.6  Total Protein 6.0 - 8.5 g/dL 6.7 7.0 7.0  Total Bilirubin 0.0 - 1.2 mg/dL 0.6 0.8 0.6  Alkaline Phos 39 - 117 IU/L 46 49 61  AST 0 - 40 IU/L 35 32 27  ALT 0 - 44 IU/L 39 38 40   Vit D, 25-Hydroxy  Date Value Ref Range Status  08/01/2017 14.6 (L) 30.0 - 100.0 ng/mL Final    Comment:    Vitamin D deficiency has been defined by the Institute of Medicine and an Endocrine Society practice guideline as a level of serum 25-OH vitamin D less than 20 ng/mL (1,2). The Endocrine Society went on to further define vitamin D insufficiency as a level between 21 and 29 ng/mL (2). 1. IOM (Institute of Medicine). 2010. Dietary reference    intakes for calcium and D. [place]: The    [employer]. 2. Holick MF, Binkley [place], Bischoff-Ferrari HA, et al.    Evaluation, treatment, and prevention of vitamin D    deficiency: an Endocrine Society clinical practice    guideline. JCEM. 2011 Jul;  96(7):1911-30.   07/27/2016 24.0 (L) 30.0 - 100.0 ng/mL Final    Comment:    Vitamin D deficiency has been defined by the Institute of Medicine and an Endocrine Society practice guideline as a level of serum 25-OH vitamin D less than 20 ng/mL (1,2). The Endocrine Society went on to further define vitamin D insufficiency as a level between 21 and 29 ng/mL (2). 1. IOM (Institute of Medicine). 2010. Dietary reference    intakes for calcium and D. [place]: The    [employer]. 2. Holick MF, Binkley [place], Bischoff-Ferrari HA, et al.    Evaluation, treatment, and prevention of vitamin D    deficiency: an Endocrine Society clinical practice    guideline. JCEM. 2011 Jul; 96(7):1911-30.   07/22/2015 12.3 (L) 30.0 - 100.0 ng/mL Final    Comment:    Vitamin D deficiency has been defined by the Institute of Medicine and an Endocrine Society practice guideline as a level of serum 25-OH vitamin D less than 20 ng/mL (1,2). The Endocrine Society went on to further define vitamin D insufficiency as a level between 21 and 29 ng/mL (2). 1. IOM (Institute of Medicine). 2010. Dietary reference    intakes for calcium and D. [place]: The    [employer]. 2. Holick MF, Binkley [place], Bischoff-Ferrari HA, et al.    Evaluation, treatment, and prevention of vitamin D    deficiency: an Endocrine Society clinical practice    guideline. JCEM. 2011 Jul; 96(7):1911-30.    No results found for: CHOL No results found for: HGBA1C No results found for: VITAMINB12 No results found for: TSH    09/15/10 MRI brain - (report states normal brain); However I reviewed images myself and there is a subtle T2 hyperintense lesion in the midbrain on the left adjacent to the aqueduct, near the superior colliculus and fourth cranial nerve nucleus. Stable from prior studies. No abnl enhancement. -VRP  09/15/10 MRI cervical spine: 1. Stable, normal MRI appearance of the cervical and visualized  thoracic spinal cord.  2. No acute findings in the cervical spine. Cervical degenerative changes, maximal at C4-C5.  01/06/15 MRI brain (with and without) demonstrating: 1. Subtle, stable T2 hyperintense lesion  near the peri-aqueductal gray on the left side. No abnormal lesions are seen on post contrast views. May represent chronic demyelinating plaque or other non-specific autoimmune/inflammatory etiology. 2. No significant change from MRI on 09/14/10.   01/06/15 MRI cervical spine (with and without) demonstrating: 1. At C4-5: disc bulging and uncovertebral joint hypertrophy with moderate biforaminal stenosis. 2. No intrinsic or abnormal enhancing spinal cord lesions. 3. No significant change from MRI on 09/14/10.     ASSESSMENT AND PLAN  58 y.o. year old male here with multiple sclerosis since 2006. Symptoms stable on rebif. Will check labs.    Dx:  MS (multiple sclerosis) (HCC) - Plan: CBC with Differential/Platelet, Comprehensive metabolic panel, VITAMIN D 25 Hydroxy (Vit-D Deficiency, Fractures)    PLAN:  MULTIPLE SCLEROSIS - continue rebif and vitamin D supplement - check CBC, CMP, vit D levels annually (per PCP)  Orders Placed This Encounter  Procedures  . CBC with Differential/Platelet  . Comprehensive metabolic panel  . VITAMIN D 25 Hydroxy (Vit-D Deficiency, Fractures)   Return in about 1 year (around 12/03/2019).    Suanne MarkerVIKRAM R. , MD 12/02/2018, 4:06 PM Certified in Neurology, Neurophysiology and Neuroimaging  [facility] [address], [address] [place], [place] [address] [phone]

## 2018-12-03 LAB — COMPREHENSIVE METABOLIC PANEL
A/G RATIO: 2 (ref 1.2–2.2)
ALK PHOS: 56 IU/L (ref 39–117)
ALT: 47 IU/L — AB (ref 0–44)
AST: 44 IU/L — AB (ref 0–40)
Albumin: 4.5 g/dL (ref 3.8–4.9)
BILIRUBIN TOTAL: 0.4 mg/dL (ref 0.0–1.2)
BUN/Creatinine Ratio: 11 (ref 9–20)
BUN: 12 mg/dL (ref 6–24)
CHLORIDE: 105 mmol/L (ref 96–106)
CO2: 27 mmol/L (ref 20–29)
Calcium: 9.6 mg/dL (ref 8.7–10.2)
Creatinine, Ser: 1.06 mg/dL (ref 0.76–1.27)
GFR calc Af Amer: 89 mL/min/{1.73_m2} (ref >59)
GFR calc non Af Amer: 77 mL/min/{1.73_m2} (ref >59)
Globulin, Total: 2.2 g/dL (ref 1.5–4.5)
Glucose: 84 mg/dL (ref 65–99)
POTASSIUM: 4 mmol/L (ref 3.5–5.2)
Sodium: 147 mmol/L — ABNORMAL HIGH (ref 134–144)
Total Protein: 6.7 g/dL (ref 6.0–8.5)

## 2018-12-03 LAB — CBC WITH DIFFERENTIAL/PLATELET
BASOS ABS: 0 10*3/uL (ref 0.0–0.2)
BASOS: 1 %
EOS (ABSOLUTE): 0.1 10*3/uL (ref 0.0–0.4)
Eos: 1 %
Hematocrit: 38.3 % (ref 37.5–51.0)
Hemoglobin: 13.1 g/dL (ref 13.0–17.7)
Immature Grans (Abs): 0 10*3/uL (ref 0.0–0.1)
Immature Granulocytes: 0 %
Lymphocytes Absolute: 1.4 10*3/uL (ref 0.7–3.1)
Lymphs: 30 %
MCH: 30.4 pg (ref 26.6–33.0)
MCHC: 34.2 g/dL (ref 31.5–35.7)
MCV: 89 fL (ref 79–97)
MONOS ABS: 0.6 10*3/uL (ref 0.1–0.9)
Monocytes: 12 %
NEUTROS ABS: 2.7 10*3/uL (ref 1.4–7.0)
NEUTROS PCT: 56 %
PLATELETS: 214 10*3/uL (ref 150–450)
RBC: 4.31 x10E6/uL (ref 4.14–5.80)
RDW: 13 % (ref 11.6–15.4)
WBC: 4.8 10*3/uL (ref 3.4–10.8)

## 2018-12-03 LAB — VITAMIN D 25 HYDROXY (VIT D DEFICIENCY, FRACTURES): VIT D 25 HYDROXY: 7.6 ng/mL — AB (ref 30.0–100.0)

## 2018-12-05 ENCOUNTER — Telehealth: Payer: Self-pay | Admitting: *Deleted

## 2018-12-05 NOTE — Telephone Encounter (Signed)
Spoke to wife of pt. Pt had not returned call from yesterday.  I relayed per Dr. Marjory Lies that VIT D very low, needs replacement.  Will forward to pcp Dr. Cindee Lame.  Also his LFT (liver function tests slightly elevated) will repeat in 3 months. Will call to remind.

## 2018-12-05 NOTE — Telephone Encounter (Signed)
Fax confirmation received Dr. Ludwig Clarks.

## 2018-12-05 NOTE — Telephone Encounter (Signed)
-----   Message from Suanne Marker, MD sent at 12/03/2018  3:52 PM EST ----- Vit D very low. Needs replacement. Also LFTs slightly elevated. Repeat in 3 months. Please call patient. -VRP

## 2018-12-23 MED FILL — REBIF 44 MCG/0.5ML SOSY: 44 | 28 days supply | Qty: 6 | Fill #3 | Status: TO

## 2019-01-31 MED FILL — REBIF 44 MCG/0.5ML SOSY: 44 | 28 days supply | Qty: 6 | Fill #0

## 2019-03-25 ENCOUNTER — Telehealth: Payer: Self-pay | Admitting: Diagnostic Neuroimaging

## 2019-03-25 NOTE — Telephone Encounter (Signed)
Monica@ [facility] has called re: a PA that is needed for pt's Rebif.  Maxine Glenn states that around the 11th at that time pt had about a week's worth left.  Maxine Glenn is asking for a call @ [phone]

## 2019-03-25 NOTE — Telephone Encounter (Addendum)
[facility] and advised her of previous conversation with other pharmacist. She stated the Rx comes from Dr Hyman Hopes but PA must come from neurologist. I called insurance, [phone] spoke with Belenda Cruise, with [employer] and answered clinical questions. Patient has been on Rebif  since at least  12/2011. She stated decision will be received here by fax within 72 hous, pharmacy will be notified, and patient will receive letter.

## 2019-03-25 NOTE — Telephone Encounter (Signed)
Maxine Glenn, pharmacist at [facility] calling to check on prior authorization for Rebif. Please call [phone]

## 2019-03-25 NOTE — Telephone Encounter (Signed)
Called pharmacy and advised pharmacist Dr Marjory Lies hasn't prescribed Rebif since 2015. Dr Hyman Hopes prescribes. I advised they call his office for Rebif PA. She  verbalized understanding, appreciation.

## 2019-03-28 MED FILL — REBIF 44 MCG/0.5ML SOSY: 44 | 28 days supply | Qty: 6 | Fill #1

## 2019-04-01 NOTE — Telephone Encounter (Addendum)
Received approval for Rebif 44 mcg/.72mL from [employer]: ref # [id number], authorized for max 12 fills from 03/27/2019 to 03/25/2020, 6 mL (12 syringes) per 28 days . Approval information faxed to [employer].

## 2019-04-30 MED FILL — REBIF 44 MCG/0.5ML SOSY: 44 | 28 days supply | Qty: 6 | Fill #0

## 2019-06-26 MED FILL — REBIF 44 MCG/0.5ML SOSY: 44 | 28 days supply | Qty: 6 | Fill #1

## 2019-09-03 ENCOUNTER — Other Ambulatory Visit: Payer: Self-pay | Admitting: Internal Medicine

## 2019-09-09 ENCOUNTER — Other Ambulatory Visit: Payer: Self-pay | Admitting: Diagnostic Neuroimaging

## 2019-09-10 ENCOUNTER — Telehealth: Payer: Self-pay | Admitting: Diagnostic Neuroimaging

## 2019-09-10 DIAGNOSIS — G35 Multiple sclerosis: Secondary | ICD-10-CM

## 2019-09-10 MED ORDER — REBIF 44 MCG/0.5ML ~~LOC~~ SOSY: 44.0000 ug | PREFILLED_SYRINGE | SUBCUTANEOUS | 11 refills | Status: DC

## 2019-09-10 NOTE — Telephone Encounter (Signed)
Called Bradley Nicholson, [facility] who stated that Rebif refills are out. They need new Rx, and in order for patient to receive medication at discount in their clinic (with Dr Doreene Burke) the Rx needs to come from Dr Leta Baptist. I advised her Dr Leta Baptist will refill. Patient has a follow up scheduled in Jan. She verbalized understanding, appreciation.

## 2019-09-10 NOTE — Addendum Note (Signed)
Addended by: Minna Antis on: 09/10/2019 02:11 PM   Modules accepted: Orders

## 2019-09-10 NOTE — Telephone Encounter (Signed)
[email or web address] - has called to inform that FQ:[id number] beta-1a (REBIF) 44 MCG/0.5ML SOSY injection The prescriber of interferon beta-1a (REBIF) 44 MCG/0.5ML SOSY injection has to be the one to authorize the refill.  Audrea Muscat can be reached at [phone], please call

## 2019-09-11 ENCOUNTER — Other Ambulatory Visit: Payer: Self-pay | Admitting: Pharmacist

## 2019-09-11 DIAGNOSIS — G35 Multiple sclerosis: Secondary | ICD-10-CM

## 2019-09-11 MED ORDER — REBIF 44 MCG/0.5ML ~~LOC~~ SOSY: 44.0000 ug | PREFILLED_SYRINGE | SUBCUTANEOUS | 11 refills | Status: DC

## 2019-09-11 MED FILL — REBIF 44 MCG/0.5ML SOSY: 44 | 28 days supply | Qty: 6 | Fill #0

## 2019-10-13 NOTE — Telephone Encounter (Signed)
Error

## 2019-10-15 ENCOUNTER — Other Ambulatory Visit: Payer: Self-pay

## 2019-10-15 ENCOUNTER — Ambulatory Visit: Payer: 59 | Attending: Family Medicine | Admitting: Pharmacist

## 2019-10-15 DIAGNOSIS — Z79899 Other long term (current) drug therapy: Secondary | ICD-10-CM

## 2019-10-15 NOTE — Progress Notes (Signed)
   S: S: Virtual Visit via Telephone Note Due to current restrictions/limitations of in-office visits due to the COVID-19 pandemic, this scheduled clinical appointment was converted to a telehealth visit  I connected withOrlando Cranston on 12/09/20at2:30 pmby telephone.Verified that I was speaking to thehimusing two patient identifiers.  I am in my office. The patient is athome.Only the patient and myself participated in this encounter.  I discussed the limitations, risks, security and privacy concerns of performing an evaluation and management service by telephone and the availability of in person appointments.   Patient presents for review of their specialty medication therapy.  Patient is currently taking Rebif for multiple sclerosis (MS). Patient is managed by Dr. Leta Baptist for this.   Adherence: denies any missed doses  Efficacy: patient is pleased with results   Dosing: Rebif (subQ): 44 mcg 3 times weekly (separate doses by at least 48 hours)  Drug-drug interactions: none  Monitoring: CBC: last one 11/2018, WNL LFTs: last one 11/2018, WNL Thyroid function tests: no recent tests on file Injection site reactions: none S/sx of malignancy: none Neuropsychiatric symptoms: none Thrombotic microangiopathy (monitor for new onset HTN, thrombocytopenia, or impaired renal dysfunction): none  O:  Lab Results  Component Value Date   WBC 4.8 12/02/2018   HGB 13.1 12/02/2018   HCT 38.3 12/02/2018   MCV 89 12/02/2018   PLT 214 12/02/2018      Chemistry      Component Value Date/Time   NA 147 (H) 12/02/2018 1618   K 4.0 12/02/2018 1618   CL 105 12/02/2018 1618   CO2 27 12/02/2018 1618   BUN 12 12/02/2018 1618   CREATININE 1.06 12/02/2018 1618      Component Value Date/Time   CALCIUM 9.6 12/02/2018 1618   ALKPHOS 56 12/02/2018 1618   AST 44 (H) 12/02/2018 1618   ALT 47 (H) 12/02/2018 1618   BILITOT 0.4 12/02/2018 1618      No results found for:  TSH   A/P: 1. Medication review: Patient currently on Rebif for the treatment of MS and is tolerating it well with no changes since last visit. Reviewed the medication with the patient, including the following: Rebif, interferon beta-1a, is an interferon indicated for the treatment of MS. Patient educated on purpose, proper use and potential adverse effects of Rebif. Analgesics and/or antipyretics may help decrease flu-like symptoms on treatment days. No recommendations for any changes.   Benard Halsted, PharmD, [place] [phone]

## 2019-10-23 MED FILL — REBIF 44 MCG/0.5ML SOSY: 44 | 28 days supply | Qty: 6 | Fill #1

## 2019-12-08 ENCOUNTER — Other Ambulatory Visit: Payer: Self-pay

## 2019-12-08 ENCOUNTER — Ambulatory Visit: Payer: 59 | Admitting: Diagnostic Neuroimaging

## 2019-12-08 ENCOUNTER — Encounter: Payer: Self-pay | Admitting: Diagnostic Neuroimaging

## 2019-12-08 VITALS — BP 128/78 | HR 59 | Temp 96.9°F | Ht 72.0 in | Wt 205.0 lb

## 2019-12-08 DIAGNOSIS — G35 Multiple sclerosis: Secondary | ICD-10-CM

## 2019-12-08 NOTE — Progress Notes (Signed)
[facility]  PATIENT: Bradley Nicholson DOB: 04/26/1961  REFERRING CLINICIAN:  HISTORY FROM: patient and wife REASON FOR VISIT: follow up (former Dr. Erling Cruz patient)   HISTORICAL  CHIEF COMPLAINT:  Chief Complaint  Patient presents with  . Multiple Sclerosis    rm 7, one yr FU wife- Cecille Rubin, "no new concerns"    HISTORY OF PRESENT ILLNESS:   UPDATE (12/08/19, VRP): Since last visit, doing well. Symptoms are stable. No alleviating or aggravating factors. Tolerating rebif. Also had covid vaccine x 2 doses; doing well.   UPDATE (12/02/18, VRP): Since last visit, doing well. Symptoms are stable. No alleviating or aggravating factors. Tolerating rebif.    UPDATE (08/01/17, VRP): Since last visit, doing well. Tolerating rebif. No alleviating or aggravating factors. No new issues.  UPDATE 07/27/16: Since last visit, doing well. Tolerating rebif. No skin site reactions.  UPDATE 07/22/15: Doing well. No new neurologic symptoms. Tolerating rebif. MRI scans and labs are stable.  UPDATE 12/30/14: Since last visit, doing about the same. No new neuro symptoms. Some intermittent cramps in hands. Tolerating rebif.  UPDATE 12/30/13: Since last visit patient doing well. No further neurologic symptoms. Patient is tolerating Rebif injections. No skin site reactions. I reviewed patient's initial symptoms in August 2006. Patient tells me that 2 weeks prior to onset of double vision 2006, patient had had palpitations, diagnosed with Wolff-Parkinson-White syndrome, and underwent cardiac catheterization with radiofrequency ablation. When patient developed double vision, he had woke up with symptoms. Symptoms lasted approximately 2 days.  PRIOR HPI (02/23/12, Dr. Erling Cruz): "59 year old right-handed married African American male first seen 06/30/05 for dizziness and double vision with an abnormal MRI study of the brain  06/27/05 showing a 3-3.5 mm lesion in the brainstem midbrain on the left, adjacent to  the aqueduct near the  superior collicus. CBC, CMP, ANA, lipid profile was normal except LDL 155. Examination revealed acuity at 20/20 right eye, 20/30 in the left and red lens showed vertical diplopia. LP 08/31/05 showed negative ACE  and VDRL with glucose 54, protein 33, no red blood cells, one white blood cell, and positive CSF oligoclonal banding with normal CSF IgG index. He began rebif on Mondays, Wednesdays, and Fridays 12/06. He has not had significant side effects from the medication. He denies bowel or bladder incontinence. He has no history of Lhermitte's sign. His memory has been good. He has no injection site sores. His last blood studies 06/03/10 were normal CBC and CMP. MRIs of the brain and cervical spine 09/14/2010 without and with contrast enhancement were normal except for degenerative changes at C4-5. 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 He exercises on a regular basis."    REVIEW OF SYSTEMS: Full 14 system review of systems performed and negative except: as per HPI.   ALLERGIES: No Known Allergies  HOME MEDICATIONS: Outpatient Medications Prior to Visit  Medication Sig Dispense Refill  . Cholecalciferol (VITAMIN D) 2000 units tablet Take 2,000 Units by mouth daily.    Marland Kitchen HYDROcodone-acetaminophen (NORCO) 5-325 MG tablet Take 1 tablet by mouth at bedtime as needed and may repeat dose one time if needed for moderate pain. 20 tablet 0  . interferon beta-1a (REBIF) 44 MCG/0.5ML SOSY injection Inject 0.5 mLs (44 mcg total) into the skin 3 (three) times a week. 6 mL 11   No facility-administered medications prior to visit.    PAST MEDICAL HISTORY: Past Medical History:  Diagnosis Date  . Multiple sclerosis ([place])  PAST SURGICAL HISTORY: Past Surgical History:  Procedure Laterality Date  . CARDIAC CATHETERIZATION  2006    FAMILY HISTORY: Family History  Problem Relation Age of Onset  . Healthy Mother   . Healthy Father     SOCIAL  HISTORY:  Social History   Socioeconomic History  . Marital status: Married    Spouse name: Lawson Fiscal  . Number of children: 1  . Years of education: Not on file  . Highest education level: Not on file  Occupational History    Employer: [employer]  Tobacco Use  . Smoking status: Never Smoker  . Smokeless tobacco: Never Used  Substance and Sexual Activity  . Alcohol use: Yes    Comment: 1 beverage  per week  . Drug use: No  . Sexual activity: Not on file  Other Topics Concern  . Not on file  Social History Narrative   Patient lives at home with his spouse.   Caffeine Use: does consume   Social Determinants of Health   Financial Resource Strain:   . Difficulty of Paying Living Expenses: Not on file  Food Insecurity:   . Worried About Programme researcher, broadcasting/film/video in the Last Year: Not on file  . Ran Out of Food in the Last Year: Not on file  Transportation Needs:   . Lack of Transportation (Medical): Not on file  . Lack of Transportation (Non-Medical): Not on file  Physical Activity:   . Days of Exercise per Week: Not on file  . Minutes of Exercise per Session: Not on file  Stress:   . Feeling of Stress : Not on file  Social Connections:   . Frequency of Communication with Friends and Family: Not on file  . Frequency of Social Gatherings with Friends and Family: Not on file  . Attends Religious Services: Not on file  . Active Member of Clubs or Organizations: Not on file  . Attends Banker Meetings: Not on file  . Marital Status: Not on file  Intimate Partner Violence:   . Fear of Current or Ex-Partner: Not on file  . Emotionally Abused: Not on file  . Physically Abused: Not on file  . Sexually Abused: Not on file     PHYSICAL EXAM  Vitals:   12/08/19 1515  BP: 128/78  Pulse: (!) 59  Temp: (!) 96.9 F (36.1 C)  Weight: 205 lb (93 kg)  Height: 6' (1.829 m)    Not recorded      Body mass index is 27.8 kg/m.  GENERAL EXAM: Patient is in no  distress; well developed, nourished and groomed; neck is supple  CARDIOVASCULAR: Regular rate and rhythm, no murmurs, no carotid bruits  NEUROLOGIC: MENTAL STATUS: awake, alert, language fluent, comprehension intact, naming intact, fund of knowledge appropriate CRANIAL NERVE: pupils equal and reactive to light, visual fields full to confrontation, extraocular muscles intact, MILD END GAZE NYSTAGMUS, facial sensation and strength symmetric, hearing intact, palate elevates symmetrically, uvula midline, shoulder shrug symmetric, tongue midline. MOTOR: normal bulk and tone, full strength in the BUE, BLE SENSORY: normal and symmetric to light touch, temperature, vibration COORDINATION: finger-nose-finger, fine finger movements normal REFLEXES: deep tendon reflexes present and symmetric GAIT/STATION: narrow based gait    DIAGNOSTIC DATA (LABS, IMAGING, TESTING) - I reviewed patient records, labs, notes, testing and imaging myself where available.   Lab Results  Component Value Date   WBC 4.8 12/02/2018   HGB 13.1 12/02/2018   HCT 38.3 12/02/2018   MCV 89  12/02/2018   PLT 214 12/02/2018   CMP Latest Ref Rng & Units 12/02/2018 08/01/2017 07/27/2016  Glucose 65 - 99 mg/dL 84 73 188(C)  BUN 6 - 24 mg/dL 12 11 12   Creatinine 0.76 - 1.27 mg/dL 1.66 0.63  Sodium 134 - 144 mmol/L 147(H) 143 143  Potassium 3.5 - 5.2 mmol/L 4.0 4.1 3.8  Chloride 96 - 106 mmol/L 105 103 103  CO2 20 - 29 mmol/L 27 27 27   Calcium 8.7 - 10.2 mg/dL 9.6 9.7 9.6  Total Protein 6.0 - 8.5 g/dL 6.7 6.7 7.0  Total Bilirubin 0.0 - 1.2 mg/dL 0.4 0.6 0.8  Alkaline Phos 39 - 117 IU/L 56 46 49  AST 0 - 40 IU/L 44(H) 35 32  ALT 0 - 44 IU/L 47(H) 39 38   Vit D, 25-Hydroxy  Date Value Ref Range Status  12/02/2018 7.6 (L) 30.0 - 100.0 ng/mL Final    Comment:    Vitamin D deficiency has been defined by the Institute of Medicine and an Endocrine Society practice guideline as a level of serum 25-OH vitamin D less than 20  ng/mL (1,2). The Endocrine Society went on to further define vitamin D insufficiency as a level between 21 and 29 ng/mL (2). 1. IOM (Institute of Medicine). 2010. Dietary reference    intakes for calcium and D. [place]: The    12/04/2018. 2. Holick MF, Binkley [place], Bischoff-Ferrari HA, et al.    Evaluation, treatment, and prevention of vitamin D    deficiency: an Endocrine Society clinical practice    guideline. JCEM. 2011 Jul; 96(7):1911-30.   08/01/2017 14.6 (L) 30.0 - 100.0 ng/mL Final    Comment:    Vitamin D deficiency has been defined by the Institute of Medicine and an Endocrine Society practice guideline as a level of serum 25-OH vitamin D less than 20 ng/mL (1,2). The Endocrine Society went on to further define vitamin D insufficiency as a level between 21 and 29 ng/mL (2). 1. IOM (Institute of Medicine). 2010. Dietary reference    intakes for calcium and D. [place]: The    08/03/2017. 2. Holick MF, Binkley [place], Bischoff-Ferrari HA, et al.    Evaluation, treatment, and prevention of vitamin D    deficiency: an Endocrine Society clinical practice    guideline. JCEM. 2011 Jul; 96(7):1911-30.   07/27/2016 24.0 (L) 30.0 - 100.0 ng/mL Final    Comment:    Vitamin D deficiency has been defined by the Institute of Medicine and an Endocrine Society practice guideline as a level of serum 25-OH vitamin D less than 20 ng/mL (1,2). The Endocrine Society went on to further define vitamin D insufficiency as a level between 21 and 29 ng/mL (2). 1. IOM (Institute of Medicine). 2010. Dietary reference    intakes for calcium and D. [place]: The    07/29/2016. 2. Holick MF, Binkley , Bischoff-Ferrari HA, et al.    Evaluation, treatment, and prevention of vitamin D    deficiency: an Endocrine Society clinical practice    guideline. JCEM. 2011 Jul; 96(7):1911-30.    No results found for: CHOL No results found for: HGBA1C No  results found for: VITAMINB12 No results found for: TSH    09/15/10 MRI brain - (report states normal brain); However I reviewed images myself and there is a subtle T2 hyperintense lesion in the midbrain on the left adjacent to the aqueduct, near the superior colliculus and fourth cranial nerve nucleus. Stable  from prior studies. No abnl enhancement. -VRP  09/15/10 MRI cervical spine: 1. Stable, normal MRI appearance of the cervical and visualized thoracic spinal cord.  2. No acute findings in the cervical spine. Cervical degenerative changes, maximal at C4-C5.  01/06/15 MRI brain (with and without) demonstrating: 1. Subtle, stable T2 hyperintense lesion near the peri-aqueductal gray on the left side. No abnormal lesions are seen on post contrast views. May represent chronic demyelinating plaque or other non-specific autoimmune/inflammatory etiology. 2. No significant change from MRI on 09/14/10.   01/06/15 MRI cervical spine (with and without) demonstrating: 1. At C4-5: disc bulging and uncovertebral joint hypertrophy with moderate biforaminal stenosis. 2. No intrinsic or abnormal enhancing spinal cord lesions. 3. No significant change from MRI on 09/14/10.     ASSESSMENT AND PLAN  59 y.o. year old male here with multiple sclerosis since 2006. Symptoms stable on rebif. Will check labs.    Dx:  MS (multiple sclerosis) (HCC)    PLAN:  MULTIPLE SCLEROSIS - continue rebif; stable - contnue vitamin D supplement - check CBC, CMP, vit D levels annually (per PCP)   Return in about 1 year (around 12/07/2020).    Suanne Marker, MD 12/08/2019, 3:41 PM Certified in Neurology, Neurophysiology and Neuroimaging  [facility] [address] [place], [place] [address] [phone]

## 2019-12-30 MED FILL — REBIF 44 MCG/0.5ML SOSY: 44 | 28 days supply | Qty: 6 | Fill #0

## 2020-01-30 MED FILL — REBIF 44 MCG/0.5ML SOSY: 44 | 28 days supply | Qty: 6 | Fill #1

## 2020-03-01 MED FILL — REBIF 44 MCG/0.5ML SOSY: 44 | 28 days supply | Qty: 6 | Fill #2

## 2020-03-25 MED FILL — REBIF 44 MCG/0.5ML SOSY: 44 | 28 days supply | Qty: 6 | Fill #3

## 2020-05-31 ENCOUNTER — Telehealth: Payer: Self-pay | Admitting: *Deleted

## 2020-05-31 NOTE — Telephone Encounter (Addendum)
Rebif PA, key: [id number] ,[id number], stable on Rebif since dx in 10/2005. Your information has been sent to [employer]. [employer] is reviewing your PA request.  If [employer] has not replied within 24 hours for urgent requests or within 48 hours for standard requests, please contact [employer] at [phone].

## 2020-06-01 MED FILL — REBIF 44 MCG/0.5ML SOSY: 44 | 28 days supply | Qty: 6 | Fill #4

## 2020-06-01 NOTE — Telephone Encounter (Signed)
Rebif approved by [employer], 05/31/20 to 06/01/2021. Approval letter faxed to [facility].

## 2020-06-30 MED FILL — REBIF 44 MCG/0.5ML SOSY: 44 | 28 days supply | Qty: 6 | Fill #5

## 2020-07-26 MED FILL — REBIF 44 MCG/0.5ML SOSY: 44 | 28 days supply | Qty: 6 | Fill #6

## 2020-08-26 MED FILL — REBIF 44 MCG/0.5ML SOSY: 44 | 28 days supply | Qty: 6 | Fill #7

## 2020-09-20 ENCOUNTER — Other Ambulatory Visit: Payer: Self-pay | Admitting: Diagnostic Neuroimaging

## 2020-09-20 ENCOUNTER — Other Ambulatory Visit: Payer: Self-pay | Admitting: Pharmacist

## 2020-09-20 DIAGNOSIS — G35 Multiple sclerosis: Secondary | ICD-10-CM

## 2020-09-20 MED ORDER — REBIF 44 MCG/0.5ML ~~LOC~~ SOSY: 44.0000 ug | PREFILLED_SYRINGE | SUBCUTANEOUS | 11 refills | Status: DC

## 2020-09-20 NOTE — Telephone Encounter (Signed)
Received 2nd request for Rebif refill , 28 day supply. Called [facility], spoke with pharmacist and advised her the first Rebif refill sent in was for 28 day supply. She was checking with pharmacist who sent it in, call cut off. Was unable to get call through again with multiple attempts.

## 2020-09-23 MED FILL — REBIF 44 MCG/0.5ML SOSY: 44 | 28 days supply | Qty: 6 | Fill #0

## 2020-10-21 MED FILL — REBIF 44 MCG/0.5ML SOSY: 44 | 28 days supply | Qty: 6 | Fill #1

## 2020-11-18 MED FILL — REBIF 44 MCG/0.5ML SOSY: 44 | 28 days supply | Qty: 6 | Fill #2

## 2020-11-22 ENCOUNTER — Telehealth: Payer: Self-pay | Admitting: Pharmacist

## 2020-11-22 NOTE — Telephone Encounter (Signed)
Called patient to schedule an appointment for the [facility] [facility]. I was unable to reach the patient so I left a HIPAA-compliant message requesting that the patient return my call.   Luke Van Ausdall, PharmD, BCACP, CPP Clinical Pharmacist [facility] [phone]  

## 2020-12-08 ENCOUNTER — Telehealth: Payer: Self-pay | Admitting: *Deleted

## 2020-12-08 ENCOUNTER — Ambulatory Visit: Payer: 59 | Admitting: Diagnostic Neuroimaging

## 2020-12-08 NOTE — Telephone Encounter (Signed)
Patient was no show for follow up appointment today.  

## 2020-12-09 ENCOUNTER — Encounter: Payer: Self-pay | Admitting: Diagnostic Neuroimaging

## 2020-12-23 MED FILL — REBIF 44 MCG/0.5ML SOSY: 44 | 28 days supply | Qty: 6 | Fill #3

## 2021-01-19 ENCOUNTER — Encounter: Payer: Self-pay | Admitting: Diagnostic Neuroimaging

## 2021-01-19 ENCOUNTER — Ambulatory Visit: Payer: 59 | Admitting: Diagnostic Neuroimaging

## 2021-01-19 ENCOUNTER — Other Ambulatory Visit: Payer: Self-pay

## 2021-01-19 VITALS — BP 126/83 | HR 50 | Ht 72.0 in | Wt 196.0 lb

## 2021-01-19 DIAGNOSIS — G35 Multiple sclerosis: Secondary | ICD-10-CM | POA: Diagnosis not present

## 2021-01-19 NOTE — Progress Notes (Signed)
[facility]  PATIENT: Bradley Nicholson DOB: 1961-01-20  REFERRING CLINICIAN:  HISTORY FROM: patient and wife REASON FOR VISIT: follow up (former Dr. Sandria Manly patient)   HISTORICAL  CHIEF COMPLAINT:  Chief Complaint  Patient presents with  . Multiple Sclerosis    Rm 7, one year FU "Rebif, doing well"    HISTORY OF PRESENT ILLNESS:   UPDATE (01/19/21, VRP): Since last visit, doing well on rebif. No new issues. Symptoms are stable. No alleviating or aggravating factors. Tolerating meds.    UPDATE (12/08/19, VRP): Since last visit, doing well. Symptoms are stable. No alleviating or aggravating factors. Tolerating rebif. Also had covid vaccine x 2 doses; doing well.   UPDATE (12/02/18, VRP): Since last visit, doing well. Symptoms are stable. No alleviating or aggravating factors. Tolerating rebif.    UPDATE (08/01/17, VRP): Since last visit, doing well. Tolerating rebif. No alleviating or aggravating factors. No new issues.  UPDATE 07/27/16: Since last visit, doing well. Tolerating rebif. No skin site reactions.  UPDATE 07/22/15: Doing well. No new neurologic symptoms. Tolerating rebif. MRI scans and labs are stable.  UPDATE 12/30/14: Since last visit, doing about the same. No new neuro symptoms. Some intermittent cramps in hands. Tolerating rebif.  UPDATE 12/30/13: Since last visit patient doing well. No further neurologic symptoms. Patient is tolerating Rebif injections. No skin site reactions. I reviewed patient's initial symptoms in August 2006. Patient tells me that 2 weeks prior to onset of double vision 2006, patient had had palpitations, diagnosed with Wolff-Parkinson-White syndrome, and underwent cardiac catheterization with radiofrequency ablation. When patient developed double vision, he had woke up with symptoms. Symptoms lasted approximately 2 days.  PRIOR HPI (02/23/12, Dr. Sandria Manly): "60 year old right-handed married African American male first seen 06/30/05 for  dizziness and double vision with an abnormal MRI study of the brain  06/27/05 showing a 3-3.5 mm lesion in the brainstem midbrain on the left, adjacent to the aqueduct near the  superior collicus. CBC, CMP, ANA, lipid profile was normal except LDL 155. Examination revealed acuity at 20/20 right eye, 20/30 in the left and red lens showed vertical diplopia. LP 08/31/05 showed negative ACE  and VDRL with glucose 54, protein 33, no red blood cells, one white blood cell, and positive CSF oligoclonal banding with normal CSF IgG index. He began rebif on Mondays, Wednesdays, and Fridays 12/06. He has not had significant side effects from the medication. He denies bowel or bladder incontinence. He has no history of Lhermitte's sign. His memory has been good. He has no injection site sores. His last blood studies 06/03/10 were normal CBC and CMP. MRIs of the brain and cervical spine 09/14/2010 without and with contrast enhancement were normal except for degenerative changes at C4-5. 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 He exercises on a regular basis."    REVIEW OF SYSTEMS: Full 14 system review of systems performed and negative except: as per HPI.   ALLERGIES: No Known Allergies  HOME MEDICATIONS: Outpatient Medications Prior to Visit  Medication Sig Dispense Refill  . Cholecalciferol (VITAMIN D) 2000 units tablet Take 2,000 Units by mouth daily.    . interferon beta-1a (REBIF) 44 MCG/0.5ML SOSY injection Inject 0.5 mLs (44 mcg total) into the skin 3 (three) times a week. 6 mL 11  . HYDROcodone-acetaminophen (NORCO) 5-325 MG tablet Take 1 tablet by mouth at bedtime as needed and may repeat dose one time if needed for moderate pain. 20 tablet 0  No facility-administered medications prior to visit.    PAST MEDICAL HISTORY: Past Medical History:  Diagnosis Date  . Multiple sclerosis ([facility])     PAST SURGICAL HISTORY: Past Surgical History:  Procedure Laterality Date  . CARDIAC  CATHETERIZATION  2006    FAMILY HISTORY: Family History  Problem Relation Age of Onset  . Healthy Mother     SOCIAL HISTORY:  Social History   Socioeconomic History  . Marital status: Married    Spouse name: Lawson Fiscal  . Number of children: 1  . Years of education: Not on file  . Highest education level: Not on file  Occupational History    Employer: [employer]  Tobacco Use  . Smoking status: Never Smoker  . Smokeless tobacco: Never Used  Substance and Sexual Activity  . Alcohol use: Yes    Comment: 1 beverage  per week  . Drug use: No  . Sexual activity: Not on file  Other Topics Concern  . Not on file  Social History Narrative   Patient lives at home with his spouse.   Caffeine Use: does consume   Social Determinants of Health   Financial Resource Strain: Not on file  Food Insecurity: Not on file  Transportation Needs: Not on file  Physical Activity: Not on file  Stress: Not on file  Social Connections: Not on file  Intimate Partner Violence: Not on file     PHYSICAL EXAM  Vitals:   01/19/21 1324  BP: 126/83  Pulse: (!) 50  Weight: 196 lb (88.9 kg)  Height: 6' (1.829 m)   Not recorded     Body mass index is 26.58 kg/m.  GENERAL EXAM: Patient is in no distress; well developed, nourished and groomed; neck is supple  CARDIOVASCULAR: Regular rate and rhythm, no murmurs, no carotid bruits  NEUROLOGIC: MENTAL STATUS: awake, alert, language fluent, comprehension intact, naming intact, fund of knowledge appropriate CRANIAL NERVE: pupils equal and reactive to light, visual fields full to confrontation, extraocular muscles intact, MILD END GAZE NYSTAGMUS, facial sensation and strength symmetric, hearing intact, palate elevates symmetrically, uvula midline, shoulder shrug symmetric, tongue midline. MOTOR: normal bulk and tone, full strength in the BUE, BLE SENSORY: normal and symmetric to light touch, temperature, vibration COORDINATION:  finger-nose-finger, fine finger movements normal REFLEXES: deep tendon reflexes present and symmetric GAIT/STATION: narrow based gait    DIAGNOSTIC DATA (LABS, IMAGING, TESTING) - I reviewed patient records, labs, notes, testing and imaging myself where available.   Lab Results  Component Value Date   WBC 4.8 12/02/2018   HGB 13.1 12/02/2018   HCT 38.3 12/02/2018   MCV 89 12/02/2018   PLT 214 12/02/2018   CMP Latest Ref Rng & Units 12/02/2018 08/01/2017 07/27/2016  Glucose 65 - 99 mg/dL 84 73 174(B)  BUN 6 - 24 mg/dL 12 11 12   Creatinine 0.76 - 1.27 mg/dL 4.49 6.75 9.16  Sodium 134 - 144 mmol/L 147(H) 143 143  Potassium 3.5 - 5.2 mmol/L 4.0 4.1 3.8  Chloride 96 - 106 mmol/L 105 103 103  CO2 20 - 29 mmol/L 27 27 27   Calcium 8.7 - 10.2 mg/dL 9.6 9.7 9.6  Total Protein 6.0 - 8.5 g/dL 6.7 6.7 7.0  Total Bilirubin 0.0 - 1.2 mg/dL 0.4 0.6 0.8  Alkaline Phos 39 - 117 IU/L 56 46 49  AST 0 - 40 IU/L 44(H) 35 32  ALT 0 - 44 IU/L 47(H) 39 38   Vit D, 25-Hydroxy  Date Value Ref Range Status  12/02/2018  7.6 (L) 30.0 - 100.0 ng/mL Final    Comment:    Vitamin D deficiency has been defined by the Institute of Medicine and an Endocrine Society practice guideline as a level of serum 25-OH vitamin D less than 20 ng/mL (1,2). The Endocrine Society went on to further define vitamin D insufficiency as a level between 21 and 29 ng/mL (2). 1. IOM (Institute of Medicine). 2010. Dietary reference    intakes for calcium and D. [place]: The    [employer]. 2. Holick MF, Binkley [place], Bischoff-Ferrari HA, et al.    Evaluation, treatment, and prevention of vitamin D    deficiency: an Endocrine Society clinical practice    guideline. JCEM. 2011 Jul; 96(7):1911-30.   08/01/2017 14.6 (L) 30.0 - 100.0 ng/mL Final    Comment:    Vitamin D deficiency has been defined by the Institute of Medicine and an Endocrine Society practice guideline as a level of serum 25-OH vitamin D less than  20 ng/mL (1,2). The Endocrine Society went on to further define vitamin D insufficiency as a level between 21 and 29 ng/mL (2). 1. IOM (Institute of Medicine). 2010. Dietary reference    intakes for calcium and D. [place]: The    [employer]. 2. Holick MF, Binkley [place], Bischoff-Ferrari HA, et al.    Evaluation, treatment, and prevention of vitamin D    deficiency: an Endocrine Society clinical practice    guideline. JCEM. 2011 Jul; 96(7):1911-30.   07/27/2016 24.0 (L) 30.0 - 100.0 ng/mL Final    Comment:    Vitamin D deficiency has been defined by the Institute of Medicine and an Endocrine Society practice guideline as a level of serum 25-OH vitamin D less than 20 ng/mL (1,2). The Endocrine Society went on to further define vitamin D insufficiency as a level between 21 and 29 ng/mL (2). 1. IOM (Institute of Medicine). 2010. Dietary reference    intakes for calcium and D. [place]: The    [employer]. 2. Holick MF, Binkley [place], Bischoff-Ferrari HA, et al.    Evaluation, treatment, and prevention of vitamin D    deficiency: an Endocrine Society clinical practice    guideline. JCEM. 2011 Jul; 96(7):1911-30.    No results found for: CHOL No results found for: HGBA1C No results found for: VITAMINB12 No results found for: TSH    09/15/10 MRI brain - (report states normal brain); However I reviewed images myself and there is a subtle T2 hyperintense lesion in the midbrain on the left adjacent to the aqueduct, near the superior colliculus and fourth cranial nerve nucleus. Stable from prior studies. No abnl enhancement. -VRP  09/15/10 MRI cervical spine: 1. Stable, normal MRI appearance of the cervical and visualized thoracic spinal cord.  2. No acute findings in the cervical spine. Cervical degenerative changes, maximal at C4-C5.  01/06/15 MRI brain (with and without) demonstrating: 1. Subtle, stable T2 hyperintense lesion near the peri-aqueductal gray  on the left side. No abnormal lesions are seen on post contrast views. May represent chronic demyelinating plaque or other non-specific autoimmune/inflammatory etiology. 2. No significant change from MRI on 09/14/10.   01/06/15 MRI cervical spine (with and without) demonstrating: 1. At C4-5: disc bulging and uncovertebral joint hypertrophy with moderate biforaminal stenosis. 2. No intrinsic or abnormal enhancing spinal cord lesions. 3. No significant change from MRI on 09/14/10.     ASSESSMENT AND PLAN  60 y.o. year old male here with multiple sclerosis since 2006. Symptoms stable  on rebif. Will check labs.    Dx:  MS (multiple sclerosis) (HCC) - Plan: CBC with Differential/Platelet, Comprehensive metabolic panel, VITAMIN D 25 Hydroxy (Vit-D Deficiency, Fractures)    PLAN:  MULTIPLE SCLEROSIS - continue rebif; stable; check labs (AST, ALT slightly up last visit) - contnue vitamin D supplement (levels low last visit; recheck labs  Orders Placed This Encounter  Procedures  . CBC with Differential/Platelet  . Comprehensive metabolic panel  . VITAMIN D 25 Hydroxy (Vit-D Deficiency, Fractures)   Return in about 1 year (around 01/19/2022).    Suanne Marker, MD 01/19/2021, 1:38 PM Certified in Neurology, Neurophysiology and Neuroimaging  [facility] [address], [address] [place], [place] [address] [phone]

## 2021-01-20 LAB — COMPREHENSIVE METABOLIC PANEL
ALT: 33 IU/L (ref 0–44)
AST: 30 IU/L (ref 0–40)
Albumin/Globulin Ratio: 1.6 (ref 1.2–2.2)
Albumin: 4.4 g/dL (ref 3.8–4.9)
Alkaline Phosphatase: 54 IU/L (ref 44–121)
BUN/Creatinine Ratio: 11 (ref 10–24)
BUN: 11 mg/dL (ref 8–27)
Bilirubin Total: 0.6 mg/dL (ref 0.0–1.2)
CO2: 24 mmol/L (ref 20–29)
Calcium: 9.7 mg/dL (ref 8.6–10.2)
Chloride: 102 mmol/L (ref 96–106)
Creatinine, Ser: 0.97 mg/dL (ref 0.76–1.27)
Globulin, Total: 2.8 g/dL (ref 1.5–4.5)
Glucose: 101 mg/dL — ABNORMAL HIGH (ref 65–99)
Potassium: 4.4 mmol/L (ref 3.5–5.2)
Sodium: 143 mmol/L (ref 134–144)
Total Protein: 7.2 g/dL (ref 6.0–8.5)
eGFR: 89 mL/min/{1.73_m2} (ref >59)

## 2021-01-20 LAB — CBC WITH DIFFERENTIAL/PLATELET
Basophils Absolute: 0 10*3/uL (ref 0.0–0.2)
Basos: 0 %
EOS (ABSOLUTE): 0.1 10*3/uL (ref 0.0–0.4)
Eos: 2 %
Hematocrit: 39 % (ref 37.5–51.0)
Hemoglobin: 13.2 g/dL (ref 13.0–17.7)
Immature Grans (Abs): 0 10*3/uL (ref 0.0–0.1)
Immature Granulocytes: 0 %
Lymphocytes Absolute: 1.3 10*3/uL (ref 0.7–3.1)
Lymphs: 27 %
MCH: 30 pg (ref 26.6–33.0)
MCHC: 33.8 g/dL (ref 31.5–35.7)
MCV: 89 fL (ref 79–97)
Monocytes Absolute: 0.4 10*3/uL (ref 0.1–0.9)
Monocytes: 8 %
Neutrophils Absolute: 2.9 10*3/uL (ref 1.4–7.0)
Neutrophils: 63 %
Platelets: 196 10*3/uL (ref 150–450)
RBC: 4.4 x10E6/uL (ref 4.14–5.80)
RDW: 13.5 % (ref 11.6–15.4)
WBC: 4.7 10*3/uL (ref 3.4–10.8)

## 2021-01-20 LAB — VITAMIN D 25 HYDROXY (VIT D DEFICIENCY, FRACTURES): Vit D, 25-Hydroxy: 33.8 ng/mL (ref 30.0–100.0)

## 2021-01-28 ENCOUNTER — Other Ambulatory Visit (HOSPITAL_COMMUNITY): Payer: Self-pay

## 2021-02-07 ENCOUNTER — Other Ambulatory Visit (HOSPITAL_COMMUNITY): Payer: Self-pay

## 2021-02-07 MED FILL — Interferon Beta-1a Soln Pref Syr 44 MCG/0.5ML: SUBCUTANEOUS | 28 days supply | Qty: 6 | Fill #0 | Status: CN

## 2021-02-08 ENCOUNTER — Other Ambulatory Visit (HOSPITAL_COMMUNITY): Payer: Self-pay

## 2021-02-08 MED FILL — Interferon Beta-1a Soln Pref Syr 44 MCG/0.5ML: SUBCUTANEOUS | 28 days supply | Qty: 6 | Fill #0 | Status: AC

## 2021-02-08 MED FILL — Interferon Beta-1a Soln Pref Syr 44 MCG/0.5ML: SUBCUTANEOUS | 28 days supply | Qty: 6 | Fill #0 | Status: CN

## 2021-02-11 ENCOUNTER — Other Ambulatory Visit (HOSPITAL_COMMUNITY): Payer: Self-pay

## 2021-02-14 ENCOUNTER — Other Ambulatory Visit (HOSPITAL_COMMUNITY): Payer: Self-pay

## 2021-03-01 ENCOUNTER — Telehealth: Payer: Self-pay | Admitting: Pharmacist

## 2021-03-01 NOTE — Telephone Encounter (Signed)
Called patient to schedule an appointment for the [facility]. I was unable to reach the patient so I left a HIPAA-compliant message requesting that the patient return my call.   Luke Van Ausdall, PharmD, BCACP, CPP Clinical Pharmacist [facility] [phone]  

## 2021-03-02 ENCOUNTER — Other Ambulatory Visit: Payer: Self-pay

## 2021-03-02 ENCOUNTER — Ambulatory Visit: Payer: 59 | Attending: Internal Medicine | Admitting: Pharmacist

## 2021-03-02 DIAGNOSIS — Z79899 Other long term (current) drug therapy: Secondary | ICD-10-CM

## 2021-03-02 NOTE — Progress Notes (Signed)
   S:  Patient presents for review of their specialty medication therapy.  Patient is currently taking Rebif for multiple sclerosis (MS). Patient is managed by Dr. Penumalli for this.   Adherence: denies any missed doses  Efficacy: patient is pleased with results   Dosing: Rebif (subQ): 44 mcg 3 times weekly (separate doses by at least 48 hours)  Drug-drug interactions: none  Monitoring: CBC: last one 01/2021, WNL LFTs: last one 01/2021, WNL Thyroid function tests: no recent tests on file Injection site reactions: none S/sx of malignancy: none Neuropsychiatric symptoms: none Thrombotic microangiopathy (monitor for new onset HTN, thrombocytopenia, or impaired renal dysfunction): none  O:  Lab Results  Component Value Date   WBC 4.7 01/19/2021   HGB 13.2 01/19/2021   HCT 39.0 01/19/2021   MCV 89 01/19/2021   PLT 196 01/19/2021      Chemistry      Component Value Date/Time   NA 143 01/19/2021 1359   K 4.4 01/19/2021 1359   CL 102 01/19/2021 1359   CO2 24 01/19/2021 1359   BUN 11 01/19/2021 1359   CREATININE 0.97 01/19/2021 1359      Component Value Date/Time   CALCIUM 9.7 01/19/2021 1359   ALKPHOS 54 01/19/2021 1359   AST 30 01/19/2021 1359   ALT 33 01/19/2021 1359   BILITOT 0.6 01/19/2021 1359      No results found for: "TSH"   A/P: 1. Medication review: Patient currently on Rebif for the treatment of MS and is tolerating it well with no changes since last visit. Reviewed the medication with the patient, including the following: Rebif, interferon beta-1a, is an interferon indicated for the treatment of MS. Patient educated on purpose, proper use and potential adverse effects of Rebif. Analgesics and/or antipyretics may help decrease flu-like symptoms on treatment days. No recommendations for any changes.   Luke Van Ausdall, PharmD, BCACP, CPP Clinical Pharmacist [facility] [phone]      

## 2021-03-09 ENCOUNTER — Other Ambulatory Visit (HOSPITAL_COMMUNITY): Payer: Self-pay

## 2021-03-09 MED FILL — Interferon Beta-1a Soln Pref Syr 44 MCG/0.5ML: SUBCUTANEOUS | 28 days supply | Qty: 6 | Fill #1 | Status: AC

## 2021-03-17 ENCOUNTER — Other Ambulatory Visit (HOSPITAL_COMMUNITY): Payer: Self-pay

## 2021-03-30 ENCOUNTER — Telehealth: Payer: Self-pay | Admitting: *Deleted

## 2021-03-30 NOTE — Telephone Encounter (Signed)
Called patient, LVM and advised labs are normal.

## 2021-04-11 ENCOUNTER — Other Ambulatory Visit (HOSPITAL_COMMUNITY): Payer: Self-pay

## 2021-04-11 MED FILL — Interferon Beta-1a Soln Pref Syr 44 MCG/0.5ML: SUBCUTANEOUS | 28 days supply | Qty: 6 | Fill #2 | Status: AC

## 2021-04-14 ENCOUNTER — Other Ambulatory Visit (HOSPITAL_COMMUNITY): Payer: Self-pay

## 2021-05-11 ENCOUNTER — Other Ambulatory Visit (HOSPITAL_COMMUNITY): Payer: Self-pay

## 2021-05-13 ENCOUNTER — Other Ambulatory Visit (HOSPITAL_COMMUNITY): Payer: Self-pay

## 2021-05-13 MED FILL — Interferon Beta-1a Soln Pref Syr 44 MCG/0.5ML: SUBCUTANEOUS | 28 days supply | Qty: 6 | Fill #3 | Status: CN

## 2021-05-16 ENCOUNTER — Other Ambulatory Visit (HOSPITAL_COMMUNITY): Payer: Self-pay

## 2021-05-17 ENCOUNTER — Other Ambulatory Visit (HOSPITAL_COMMUNITY): Payer: Self-pay

## 2021-05-17 MED FILL — Interferon Beta-1a Soln Pref Syr 44 MCG/0.5ML: SUBCUTANEOUS | 28 days supply | Qty: 6 | Fill #3 | Status: CN

## 2021-05-18 ENCOUNTER — Telehealth: Payer: Self-pay | Admitting: *Deleted

## 2021-05-18 NOTE — Telephone Encounter (Signed)
Rebif PA, key: [id number]. Stable on Rebif since diagnosis in 2006. Your information has been sent to [employer].

## 2021-05-19 ENCOUNTER — Other Ambulatory Visit (HOSPITAL_COMMUNITY): Payer: Self-pay

## 2021-05-19 MED FILL — Interferon Beta-1a Soln Pref Syr 44 MCG/0.5ML: SUBCUTANEOUS | 28 days supply | Qty: 6 | Fill #3 | Status: AC

## 2021-05-19 NOTE — Telephone Encounter (Signed)
[employer] approved Rebif until 05/17/2022. Approval letter faxed to pharmacy.

## 2021-06-13 ENCOUNTER — Other Ambulatory Visit (HOSPITAL_COMMUNITY): Payer: Self-pay

## 2021-06-14 ENCOUNTER — Other Ambulatory Visit (HOSPITAL_COMMUNITY): Payer: Self-pay

## 2021-06-14 MED FILL — Interferon Beta-1a Soln Pref Syr 44 MCG/0.5ML: SUBCUTANEOUS | 28 days supply | Qty: 6 | Fill #4 | Status: AC

## 2021-06-15 ENCOUNTER — Other Ambulatory Visit (HOSPITAL_COMMUNITY): Payer: Self-pay

## 2021-07-08 ENCOUNTER — Other Ambulatory Visit (HOSPITAL_COMMUNITY): Payer: Self-pay

## 2021-07-12 ENCOUNTER — Other Ambulatory Visit (HOSPITAL_COMMUNITY): Payer: Self-pay

## 2021-07-14 ENCOUNTER — Other Ambulatory Visit (HOSPITAL_COMMUNITY): Payer: Self-pay

## 2021-09-01 ENCOUNTER — Other Ambulatory Visit (HOSPITAL_COMMUNITY): Payer: Self-pay

## 2021-09-01 MED FILL — Interferon Beta-1a Soln Pref Syr 44 MCG/0.5ML: SUBCUTANEOUS | 28 days supply | Qty: 6 | Fill #5 | Status: AC

## 2021-09-05 ENCOUNTER — Other Ambulatory Visit (HOSPITAL_COMMUNITY): Payer: Self-pay

## 2021-09-30 ENCOUNTER — Other Ambulatory Visit (HOSPITAL_COMMUNITY): Payer: Self-pay

## 2021-09-30 ENCOUNTER — Other Ambulatory Visit: Payer: Self-pay | Admitting: Diagnostic Neuroimaging

## 2021-09-30 DIAGNOSIS — G35 Multiple sclerosis: Secondary | ICD-10-CM

## 2021-10-03 ENCOUNTER — Other Ambulatory Visit: Payer: Self-pay | Admitting: Pharmacist

## 2021-10-03 ENCOUNTER — Other Ambulatory Visit (HOSPITAL_COMMUNITY): Payer: Self-pay

## 2021-10-03 ENCOUNTER — Other Ambulatory Visit: Payer: Self-pay | Admitting: Diagnostic Neuroimaging

## 2021-10-03 DIAGNOSIS — G35 Multiple sclerosis: Secondary | ICD-10-CM

## 2021-10-03 MED ORDER — REBIF 44 MCG/0.5ML ~~LOC~~ SOSY
PREFILLED_SYRINGE | SUBCUTANEOUS | 11 refills | Status: DC
  Filled 2021-10-03: qty 6, 28d supply, fill #0
  Filled 2021-10-28: qty 6, 28d supply, fill #1
  Filled 2021-11-28: qty 6, 28d supply, fill #2
  Filled 2021-12-20: qty 6, 28d supply, fill #3
  Filled 2022-01-17: qty 6, 28d supply, fill #4
  Filled 2022-02-10 – 2022-03-20 (×2): qty 6, 28d supply, fill #5
  Filled 2022-05-02: qty 6, 28d supply, fill #6
  Filled 2022-05-25: qty 6, 28d supply, fill #7
  Filled 2022-06-20: qty 6, 28d supply, fill #8
  Filled 2022-07-19: qty 6, 28d supply, fill #9
  Filled 2022-08-21: qty 6, 28d supply, fill #10
  Filled 2022-09-12: qty 6, 28d supply, fill #11

## 2021-10-03 MED ORDER — REBIF 44 MCG/0.5ML ~~LOC~~ SOSY
PREFILLED_SYRINGE | SUBCUTANEOUS | 11 refills | Status: DC
  Filled 2021-10-03: qty 6, fill #0

## 2021-10-04 ENCOUNTER — Other Ambulatory Visit (HOSPITAL_COMMUNITY): Payer: Self-pay

## 2021-10-26 ENCOUNTER — Other Ambulatory Visit (HOSPITAL_COMMUNITY): Payer: Self-pay

## 2021-10-28 ENCOUNTER — Other Ambulatory Visit (HOSPITAL_COMMUNITY): Payer: Self-pay

## 2021-11-01 ENCOUNTER — Other Ambulatory Visit (HOSPITAL_COMMUNITY): Payer: Self-pay

## 2021-11-17 ENCOUNTER — Other Ambulatory Visit (HOSPITAL_COMMUNITY): Payer: Self-pay

## 2021-11-28 ENCOUNTER — Other Ambulatory Visit (HOSPITAL_COMMUNITY): Payer: Self-pay

## 2021-11-29 ENCOUNTER — Other Ambulatory Visit (HOSPITAL_COMMUNITY): Payer: Self-pay

## 2021-12-19 ENCOUNTER — Encounter: Payer: Self-pay | Admitting: Diagnostic Neuroimaging

## 2021-12-19 ENCOUNTER — Telehealth: Payer: Self-pay | Admitting: Diagnostic Neuroimaging

## 2021-12-19 NOTE — Telephone Encounter (Signed)
I tried to call the patient to reschedule due to Dr. Richrd Humbles schedule change, but I was not able to reach him or LVM so I sent a letter to him asking to call us back to reschedule.

## 2021-12-20 ENCOUNTER — Other Ambulatory Visit (HOSPITAL_COMMUNITY): Payer: Self-pay

## 2021-12-26 ENCOUNTER — Other Ambulatory Visit (HOSPITAL_COMMUNITY): Payer: Self-pay

## 2022-01-17 ENCOUNTER — Other Ambulatory Visit (HOSPITAL_COMMUNITY): Payer: Self-pay

## 2022-01-24 ENCOUNTER — Other Ambulatory Visit (HOSPITAL_COMMUNITY): Payer: Self-pay

## 2022-01-25 ENCOUNTER — Ambulatory Visit: Payer: 59 | Admitting: Diagnostic Neuroimaging

## 2022-02-09 ENCOUNTER — Other Ambulatory Visit (HOSPITAL_COMMUNITY): Payer: Self-pay

## 2022-02-10 ENCOUNTER — Other Ambulatory Visit (HOSPITAL_COMMUNITY): Payer: Self-pay

## 2022-02-20 ENCOUNTER — Other Ambulatory Visit (HOSPITAL_COMMUNITY): Payer: Self-pay

## 2022-02-22 ENCOUNTER — Other Ambulatory Visit (HOSPITAL_COMMUNITY): Payer: Self-pay

## 2022-03-01 ENCOUNTER — Telehealth: Payer: Self-pay | Admitting: Pharmacist

## 2022-03-01 NOTE — Telephone Encounter (Signed)
Called patient to schedule an appointment for the [facility]. I was unable to reach the patient so I left a HIPAA-compliant message requesting that the patient return my call.   Luke Van Ausdall, PharmD, BCACP, CPP Clinical Pharmacist [facility] [phone]  

## 2022-03-06 ENCOUNTER — Encounter: Payer: Self-pay | Admitting: Diagnostic Neuroimaging

## 2022-03-06 ENCOUNTER — Ambulatory Visit: Payer: 59 | Admitting: Diagnostic Neuroimaging

## 2022-03-06 VITALS — BP 116/76 | HR 49 | Ht 72.0 in | Wt 189.2 lb

## 2022-03-06 DIAGNOSIS — G35 Multiple sclerosis: Secondary | ICD-10-CM | POA: Diagnosis not present

## 2022-03-06 NOTE — Progress Notes (Signed)
? ?[facility] ? ?PATIENT: Bradley Nicholson ?DOB: [birth year] ? ?REFERRING CLINICIAN:  ?HISTORY FROM: patient and wife ?REASON FOR VISIT: follow up (former Dr. Erling Cruz patient) ? ? ?HISTORICAL ? ?CHIEF COMPLAINT:  ?Chief Complaint  ?Patient presents with  ? Multiple Sclerosis  ?  Rm 6 One Year FU  wife- Cecille Rubin  "no new concerns"  ? ? ?HISTORY OF PRESENT ILLNESS:  ? ?UPDATE (03/06/22, VRP): Since last visit, doing well. Symptoms are stable. Rebif stable.  ? ?UPDATE (01/19/21, VRP): Since last visit, doing well on rebif. No new issues. Symptoms are stable. No alleviating or aggravating factors. Tolerating meds.   ? ?UPDATE (12/08/19, VRP): Since last visit, doing well. Symptoms are stable. No alleviating or aggravating factors. Tolerating rebif. Also had covid vaccine x 2 doses; doing well.  ? ?UPDATE (12/02/18, VRP): Since last visit, doing well. Symptoms are stable. No alleviating or aggravating factors. Tolerating rebif.   ? ?UPDATE (08/01/17, VRP): Since last visit, doing well. Tolerating rebif. No alleviating or aggravating factors. No new issues. ? ?UPDATE 07/27/16: Since last visit, doing well. Tolerating rebif. No skin site reactions. ? ?UPDATE 07/22/15: Doing well. No new neurologic symptoms. Tolerating rebif. MRI scans and labs are stable. ? ?UPDATE 12/30/14: Since last visit, doing about the same. No new neuro symptoms. Some intermittent cramps in hands. Tolerating rebif. ? ?UPDATE 12/30/13: Since last visit patient doing well. No further neurologic symptoms. Patient is tolerating Rebif injections. No skin site reactions. I reviewed patient's initial symptoms in August 2006. Patient tells me that 2 weeks prior to onset of double vision 2006, patient had had palpitations, diagnosed with Wolff-Parkinson-White syndrome, and underwent cardiac catheterization with radiofrequency ablation. When patient developed double vision, he had woke up with symptoms. Symptoms lasted approximately 2 days. ? ?PRIOR HPI  (02/23/12, Dr. Erling Cruz): "61 year old right-handed married African American male first seen 06/30/05 for dizziness and double vision with an abnormal MRI study of the brain  06/27/05 showing a 3-3.5 mm lesion in the brainstem midbrain on the left, adjacent to the aqueduct near the  superior collicus. CBC, CMP, ANA, lipid profile was normal except LDL 155. Examination revealed acuity at 20/20 right eye, 20/30 in the left and red lens showed vertical diplopia. LP 08/31/05 showed negative ACE  and VDRL with glucose 54, protein 33, no red blood cells, one white blood cell, and positive CSF oligoclonal banding with normal CSF IgG index. He began rebif on Mondays, Wednesdays, and Fridays 12/06. He has not had significant side effects from the medication. He denies bowel or bladder incontinence. He has no history of Lhermitte's sign. His memory has been good. He has no injection site sores. His last blood studies 06/03/10 were normal CBC and CMP. MRIs of the brain and cervical spine 09/14/2010 without and with contrast enhancement were normal except for degenerative changes at C4-5. 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 He exercises on a regular basis."  ? ? ?REVIEW OF SYSTEMS: Full 14 system review of systems performed and negative except: as per HPI.  ? ?ALLERGIES: ?No Known Allergies ? ?HOME MEDICATIONS: ?Outpatient Medications Prior to Visit  ?Medication Sig Dispense Refill  ? Cholecalciferol (VITAMIN D) 2000 units tablet Take 2,000 Units by mouth daily.    ? interferon beta-1a (REBIF) 44 MCG/0.5ML SOSY injection INJECT 0.5 MLS (44 MCG TOTAL) INTO THE SKIN 3 TIMES A WEEK. 6 mL 11  ? ?No facility-administered medications prior to visit.  ? ? ?PAST MEDICAL HISTORY: ?  Past Medical History:  ?Diagnosis Date  ? Multiple sclerosis ([place])   ? ? ?PAST SURGICAL HISTORY: ?Past Surgical History:  ?Procedure Laterality Date  ? CARDIAC CATHETERIZATION  2006  ? ? ?FAMILY HISTORY: ?Family History  ?Problem Relation  Age of Onset  ? Healthy Mother   ? ? ?SOCIAL HISTORY: ? ?Social History  ? ?Socioeconomic History  ? Marital status: Married  ?  Spouse name: Cecille Rubin  ? Number of children: 1  ? Years of education: Not on file  ? Highest education level: Not on file  ?Occupational History  ?  Employer: [employer]  ?Tobacco Use  ? Smoking status: Never  ? Smokeless tobacco: Never  ?Substance and Sexual Activity  ? Alcohol use: Yes  ?  Comment: 1 beverage  per week  ? Drug use: No  ? Sexual activity: Not on file  ?Other Topics Concern  ? Not on file  ?Social History Narrative  ? Patient lives at home with his spouse.  ? Caffeine Use: does consume  ? ?Social Determinants of Health  ? ?Financial Resource Strain: Not on file  ?Food Insecurity: Not on file  ?Transportation Needs: Not on file  ?Physical Activity: Not on file  ?Stress: Not on file  ?Social Connections: Not on file  ?Intimate Partner Violence: Not on file  ? ? ? ?PHYSICAL EXAM ? ?Vitals:  ? 03/06/22 1619  ?BP: 116/76  ?Pulse: (!) 49  ?Weight: 189 lb 3.2 oz (85.8 kg)  ?Height: 6' (1.829 m)  ? ?Not recorded ?  ? ? ?Body mass index is 25.66 kg/m?. ? ?GENERAL EXAM: ?Patient is in no distress; well developed, nourished and groomed; neck is supple ? ?CARDIOVASCULAR: ?Regular rate and rhythm, no murmurs, no carotid bruits ? ?NEUROLOGIC: ?MENTAL STATUS: awake, alert, language fluent, comprehension intact, naming intact, fund of knowledge appropriate ?CRANIAL NERVE: pupils equal and reactive to light, visual fields full to confrontation, extraocular muscles intact, MILD END GAZE NYSTAGMUS, facial sensation and strength symmetric, hearing intact, palate elevates symmetrically, uvula midline, shoulder shrug symmetric, tongue midline. ?MOTOR: normal bulk and tone, full strength in the BUE, BLE ?SENSORY: normal and symmetric to light touch, temperature, vibration ?COORDINATION: finger-nose-finger, fine finger movements normal ?REFLEXES: deep tendon reflexes present and  symmetric ?GAIT/STATION: narrow based gait ? ? ? ?DIAGNOSTIC DATA (LABS, IMAGING, TESTING) ?- I reviewed patient records, labs, notes, testing and imaging myself where available. ? ? ?Lab Results  ?Component Value Date  ? WBC 4.7 01/19/2021  ? HGB 13.2 01/19/2021  ? HCT 39.0 01/19/2021  ? MCV 89 01/19/2021  ? PLT 196 01/19/2021  ? ? ?  Latest Ref Rng & Units 01/19/2021  ?  1:59 PM 12/02/2018  ?  4:18 PM 08/01/2017  ?  3:43 PM  ?CMP  ?Glucose 65 - 99 mg/dL 101   84   73    ?BUN 8 - 27 mg/dL 11   12   11     ?Creatinine 0.76 - 1.27 mg/dL 0.97   1.06   1.09    ?Sodium 134 - 144 mmol/L 143   147   143    ?Potassium 3.5 - 5.2 mmol/L 4.4   4.0   4.1    ?Chloride 96 - 106 mmol/L 102   105   103    ?CO2 20 - 29 mmol/L 24   27   27     ?Calcium 8.6 - 10.2 mg/dL 9.7   9.6   9.7    ?Total Protein 6.0 - 8.5  g/dL 7.2   6.7   6.7    ?Total Bilirubin 0.0 - 1.2 mg/dL 0.6   0.4   0.6    ?Alkaline Phos 44 - 121 IU/L 54   56   46    ?AST 0 - 40 IU/L 30   44   35    ?ALT 0 - 44 IU/L 33   47   39    ? ?Vit D, 25-Hydroxy  ?Date Value Ref Range Status  ?01/19/2021 33.8 30.0 - 100.0 ng/mL Final  ?  Comment:  ?  Vitamin D deficiency has been defined by the Institute of ?Medicine and an Endocrine Society practice guideline as a ?level of serum 25-OH vitamin D less than 20 ng/mL (1,2). ?The Endocrine Society went on to further define vitamin D ?insufficiency as a level between 21 and 29 ng/mL (2). ?1. IOM (Institute of Medicine). 2010. Dietary reference ?   intakes for calcium and D. [place]: The ?   [employer]. ?2. Holick MF, [place], Bischoff-Ferrari HA, et al. ?   Evaluation, treatment, and prevention of vitamin D ?   deficiency: an Endocrine Society clinical practice ?   guideline. JCEM. 2011 Jul; 96(7):1911-30. ?  ?12/02/2018 7.6 (L) 30.0 - 100.0 ng/mL Final  ?  Comment:  ?  Vitamin D deficiency has been defined by the Institute of ?Medicine and an Endocrine Society practice guideline as a ?level of serum 25-OH vitamin  D less than 20 ng/mL (1,2). ?The Endocrine Society went on to further define vitamin D ?insufficiency as a level between 21 and 29 ng/mL (2). ?1. IOM (Institute of Medicine). 2010. Dietary reference ?   intakes for calcium and

## 2022-03-07 ENCOUNTER — Other Ambulatory Visit (HOSPITAL_COMMUNITY): Payer: Self-pay

## 2022-03-10 ENCOUNTER — Ambulatory Visit
Admission: EM | Admit: 2022-03-10 | Discharge: 2022-03-10 | Disposition: A | Discharge status: Home / Self Care | Payer: 59 | Attending: Internal Medicine | Admitting: Internal Medicine

## 2022-03-10 ENCOUNTER — Other Ambulatory Visit (HOSPITAL_COMMUNITY): Payer: Self-pay

## 2022-03-10 DIAGNOSIS — L239 Allergic contact dermatitis, unspecified cause: Secondary | ICD-10-CM | POA: Diagnosis not present

## 2022-03-10 DIAGNOSIS — R21 Rash and other nonspecific skin eruption: Secondary | ICD-10-CM

## 2022-03-10 MED ORDER — PREDNISONE 10 MG PO TABS
ORAL_TABLET | Freq: Every day | ORAL | 0 refills | Status: DC
  Filled 2022-03-10: qty 42, 12d supply, fill #0

## 2022-03-10 NOTE — ED Provider Notes (Signed)
?[facility] ? ? ? ?CSN: [id number] ?Arrival date & time: 03/10/22  1622 ? ? ?  ? ?History   ?Chief Complaint ?Chief Complaint  ?Patient presents with  ? Rash  ? ? ?HPI ?Bradley Nicholson is a 61 y.o. male.  ? ?Patient presents with itchy rash to bilateral upper extremities, chest, stomach, back, neck that has been present for a few days.  Denies any changes in environment including lotions, soaps, detergents, foods, etc.  Patient has been using "psoriasis cream" with no improvement in symptoms.  Denies any associated fevers. ? ? ?Rash ? ?Past Medical History:  ?Diagnosis Date  ? Multiple sclerosis ([facility])   ? ? ?Patient Active Problem List  ? Diagnosis Date Noted  ? Encounter for monitoring immunomodulating therapy 07/27/2016  ? MS (multiple sclerosis) ([facility]) 11/06/2008  ? ? ?Past Surgical History:  ?Procedure Laterality Date  ? CARDIAC CATHETERIZATION  2006  ? ? ? ? ? ?Home Medications   ? ?Prior to Admission medications   ?Medication Sig Start Date End Date Taking? Authorizing Provider  ?predniSONE (STERAPRED UNI-PAK 21 TAB) 10 MG (21) TBPK tablet Take by mouth daily. Take 6 tabs by mouth daily  for 2 days, then 5 tabs for 2 days, then 4 tabs for 2 days, then 3 tabs for 2 days, 2 tabs for 2 days, then 1 tab by mouth daily for 2 days 03/10/22  Yes Gustavus Bryant, FNP  ?Cholecalciferol (VITAMIN D) 2000 units tablet Take 2,000 Units by mouth daily.    [provider]  ?interferon beta-1a (REBIF) 44 MCG/0.5ML SOSY injection INJECT 0.5 MLS (44 MCG TOTAL) INTO THE SKIN 3 TIMES A WEEK. 10/03/21 10/03/22  Quentin Angst, MD  ? ? ?Family History ?Family History  ?Problem Relation Age of Onset  ? Healthy Mother   ? ? ?Social History ?Social History  ? ?Tobacco Use  ? Smoking status: Never  ? Smokeless tobacco: Never  ?Substance Use Topics  ? Alcohol use: Yes  ?  Comment: 1 beverage  per week  ? Drug use: No  ? ? ? ?Allergies   ?Patient has no known allergies. ? ? ?Review of Systems ?Review of Systems ?Per  HPI ? ?Physical Exam ?Triage Vital Signs ?ED Triage Vitals  ?Enc Vitals Group  ?   BP 03/10/22 1656 127/79  ?   Pulse Rate 03/10/22 1656 (!) 53  ?   Resp 03/10/22 1656 18  ?   Temp 03/10/22 1656 97.9 ?F (36.6 ?C)  ?   Temp src --   ?   SpO2 03/10/22 1656 97 %  ?   Weight --   ?   Height --   ?   Head Circumference --   ?   Peak Flow --   ?   Pain Score 03/10/22 1655 0  ?   Pain Loc --   ?   Pain Edu? --   ?   Excl. in GC? --   ? ?No data found. ? ?Updated Vital Signs ?BP 127/79 (BP Location: Right Arm)   Pulse (!) 53   Temp 97.9 ?F (36.6 ?C)   Resp 18   SpO2 97%  ? ?Visual Acuity ?Right Eye Distance:   ?Left Eye Distance:   ?Bilateral Distance:   ? ?Right Eye Near:   ?Left Eye Near:    ?Bilateral Near:    ? ?Physical Exam ?Constitutional:   ?   General: He is not in acute distress. ?   Appearance:  Normal appearance. He is not toxic-appearing or diaphoretic.  ?HENT:  ?   Head: Normocephalic and atraumatic.  ?Eyes:  ?   Extraocular Movements: Extraocular movements intact.  ?   Conjunctiva/sclera: Conjunctivae normal.  ?Pulmonary:  ?   Effort: Pulmonary effort is normal.  ?Skin: ?   Findings: Rash present.  ?   Comments: Diffuse macular papular rash present to bilateral upper extremities, chest, back, stomach, neck.  No purulent drainage noted.  ?Neurological:  ?   General: No focal deficit present.  ?   Mental Status: He is alert and oriented to person, place, and time. Mental status is at baseline.  ?Psychiatric:     ?   Mood and Affect: Mood normal.     ?   Behavior: Behavior normal.     ?   Thought Content: Thought content normal.     ?   Judgment: Judgment normal.  ? ? ? ?UC Treatments / Results  ?Labs ?(all labs ordered are listed, but only abnormal results are displayed) ?Labs Reviewed - No data to display ? ?EKG ? ? ?Radiology ?No results found. ? ?Procedures ?Procedures (including critical care time) ? ?Medications Ordered in UC ?Medications - No data to display ? ?Initial Impression / Assessment and Plan  / UC Course  ?I have reviewed the triage vital signs and the nursing notes. ? ?Pertinent labs & imaging results that were available during my care of the patient were reviewed by me and considered in my medical decision making (see chart for details). ? ?  ? ?Rash is consistent with allergic contact dermatitis.  Will treat with prednisone steroid taper given how diffuse rash is.  There does not appear to be any contraindications to prednisone at this time.  Discussed supportive care with patient.  Patient advised to return if symptoms persist or worsen.  Patient verbalized understanding and was agreeable with plan. ?Final Clinical Impressions(s) / UC Diagnoses  ? ?Final diagnoses:  ?Rash and nonspecific skin eruption  ?Allergic contact dermatitis, unspecified trigger  ? ? ? ?Discharge Instructions   ? ?  ?You have been prescribed a prednisone steroid to help alleviate inflammation and itching associated with rash.  Please follow-up if symptoms persist or worsen. ? ? ? ?ED Prescriptions   ? ? Medication Sig Dispense Auth. Provider  ? predniSONE (STERAPRED UNI-PAK 21 TAB) 10 MG (21) TBPK tablet Take by mouth daily. Take 6 tabs by mouth daily  for 2 days, then 5 tabs for 2 days, then 4 tabs for 2 days, then 3 tabs for 2 days, 2 tabs for 2 days, then 1 tab by mouth daily for 2 days 42 tablet [place], Acie Fredrickson, [place]  ? ?  ? ?PDMP not reviewed this encounter. ?  ?Gustavus Bryant, [place] ?03/10/22 1720 ? ?

## 2022-03-10 NOTE — ED Triage Notes (Signed)
Patient presents to Urgent Care with complaints of rash arms, back and neck since last week. Patient reports putting creams with no relif.  ? ?

## 2022-03-10 NOTE — Discharge Instructions (Signed)
You have been prescribed a prednisone steroid to help alleviate inflammation and itching associated with rash.  Please follow-up if symptoms persist or worsen. ?

## 2022-03-20 ENCOUNTER — Other Ambulatory Visit (HOSPITAL_COMMUNITY): Payer: Self-pay

## 2022-03-23 ENCOUNTER — Other Ambulatory Visit (HOSPITAL_COMMUNITY): Payer: Self-pay

## 2022-04-13 ENCOUNTER — Other Ambulatory Visit (HOSPITAL_COMMUNITY): Payer: Self-pay

## 2022-04-17 ENCOUNTER — Other Ambulatory Visit (HOSPITAL_COMMUNITY): Payer: Self-pay

## 2022-04-19 ENCOUNTER — Other Ambulatory Visit (HOSPITAL_COMMUNITY): Payer: Self-pay

## 2022-05-02 ENCOUNTER — Other Ambulatory Visit (HOSPITAL_COMMUNITY): Payer: Self-pay

## 2022-05-03 ENCOUNTER — Other Ambulatory Visit (HOSPITAL_COMMUNITY): Payer: Self-pay

## 2022-05-10 ENCOUNTER — Telehealth: Payer: Self-pay | Admitting: *Deleted

## 2022-05-10 NOTE — Telephone Encounter (Signed)
Rebif PA, Key: [id number]. Your information has been sent to [employer].

## 2022-05-15 ENCOUNTER — Other Ambulatory Visit (HOSPITAL_COMMUNITY): Payer: Self-pay

## 2022-05-15 NOTE — Telephone Encounter (Signed)
Rebif approved for 12 fill(s) from 05/12/2022 to 05/12/2023. Approval letter faxed to  pharmacy.

## 2022-05-24 ENCOUNTER — Other Ambulatory Visit (HOSPITAL_COMMUNITY): Payer: Self-pay

## 2022-05-25 ENCOUNTER — Other Ambulatory Visit (HOSPITAL_COMMUNITY): Payer: Self-pay

## 2022-05-29 ENCOUNTER — Other Ambulatory Visit (HOSPITAL_COMMUNITY): Payer: Self-pay

## 2022-06-05 ENCOUNTER — Telehealth: Payer: Self-pay | Admitting: Pharmacist

## 2022-06-05 NOTE — Telephone Encounter (Signed)
Called patient to schedule an appointment for the [facility]. I was unable to reach the patient so I left a HIPAA-compliant message requesting that the patient return my call.   Luke Van Ausdall, PharmD, BCACP, CPP Clinical Pharmacist [facility] [phone]  

## 2022-06-20 ENCOUNTER — Other Ambulatory Visit (HOSPITAL_COMMUNITY): Payer: Self-pay

## 2022-06-27 ENCOUNTER — Other Ambulatory Visit (HOSPITAL_COMMUNITY): Payer: Self-pay

## 2022-07-19 ENCOUNTER — Other Ambulatory Visit (HOSPITAL_COMMUNITY): Payer: Self-pay

## 2022-07-25 ENCOUNTER — Other Ambulatory Visit (HOSPITAL_COMMUNITY): Payer: Self-pay

## 2022-08-15 ENCOUNTER — Other Ambulatory Visit (HOSPITAL_COMMUNITY): Payer: Self-pay

## 2022-08-17 ENCOUNTER — Other Ambulatory Visit (HOSPITAL_COMMUNITY): Payer: Self-pay

## 2022-08-21 ENCOUNTER — Other Ambulatory Visit (HOSPITAL_COMMUNITY): Payer: Self-pay

## 2022-08-22 ENCOUNTER — Other Ambulatory Visit (HOSPITAL_COMMUNITY): Payer: Self-pay

## 2022-09-12 ENCOUNTER — Other Ambulatory Visit (HOSPITAL_COMMUNITY): Payer: Self-pay

## 2022-09-19 ENCOUNTER — Other Ambulatory Visit (HOSPITAL_COMMUNITY): Payer: Self-pay

## 2022-09-21 DIAGNOSIS — R351 Nocturia: Secondary | ICD-10-CM | POA: Diagnosis not present

## 2022-09-21 DIAGNOSIS — G35 Multiple sclerosis: Secondary | ICD-10-CM | POA: Diagnosis not present

## 2022-09-21 DIAGNOSIS — Z0001 Encounter for general adult medical examination with abnormal findings: Secondary | ICD-10-CM | POA: Diagnosis not present

## 2022-10-09 ENCOUNTER — Other Ambulatory Visit: Payer: Self-pay | Admitting: Diagnostic Neuroimaging

## 2022-10-09 ENCOUNTER — Other Ambulatory Visit (HOSPITAL_COMMUNITY): Payer: Self-pay

## 2022-10-09 DIAGNOSIS — G35D Multiple sclerosis, unspecified: Secondary | ICD-10-CM

## 2022-10-09 DIAGNOSIS — G35 Multiple sclerosis: Secondary | ICD-10-CM

## 2022-10-09 MED ORDER — REBIF 44 MCG/0.5ML ~~LOC~~ SOSY
PREFILLED_SYRINGE | SUBCUTANEOUS | 5 refills | Status: DC
  Filled 2022-10-09: qty 6, fill #0

## 2022-10-10 ENCOUNTER — Other Ambulatory Visit (HOSPITAL_COMMUNITY): Payer: Self-pay

## 2022-10-12 ENCOUNTER — Other Ambulatory Visit (HOSPITAL_COMMUNITY): Payer: Self-pay

## 2022-10-13 ENCOUNTER — Ambulatory Visit: Payer: 59 | Attending: Internal Medicine | Admitting: Pharmacist

## 2022-10-13 ENCOUNTER — Other Ambulatory Visit (HOSPITAL_COMMUNITY): Payer: Self-pay

## 2022-10-13 DIAGNOSIS — Z79899 Other long term (current) drug therapy: Secondary | ICD-10-CM

## 2022-10-13 DIAGNOSIS — G35 Multiple sclerosis: Secondary | ICD-10-CM

## 2022-10-13 MED ORDER — REBIF 44 MCG/0.5ML ~~LOC~~ SOSY
PREFILLED_SYRINGE | SUBCUTANEOUS | 5 refills | Status: DC
  Filled 2022-10-13 – 2022-12-11 (×4): qty 6, 28d supply, fill #0
  Filled 2023-01-03: qty 6, 28d supply, fill #1
  Filled 2023-01-29: qty 6, 28d supply, fill #2
  Filled 2023-03-02: qty 6, 28d supply, fill #3
  Filled 2023-03-29: qty 6, 28d supply, fill #4
  Filled 2023-04-30: qty 6, 28d supply, fill #5

## 2022-10-13 NOTE — Progress Notes (Signed)
   S:  Patient presents for review of their specialty medication therapy.  Patient is currently taking Rebif for multiple sclerosis (MS). Patient is managed by Dr. Marjory Lies for this.   Adherence: denies any missed doses  Efficacy: patient is pleased with results   Dosing: Rebif (subQ): 44 mcg 3 times weekly (separate doses by at least 48 hours)  Drug-drug interactions: none  Monitoring: CBC: last one 01/2021, WNL LFTs: last one 01/2021, WNL Thyroid function tests: no recent tests on file Injection site reactions: none S/sx of malignancy: none Neuropsychiatric symptoms: none Thrombotic microangiopathy (monitor for new onset HTN, thrombocytopenia, or impaired renal dysfunction): none  O:  Lab Results  Component Value Date   WBC 4.7 01/19/2021   HGB 13.2 01/19/2021   HCT 39.0 01/19/2021   MCV 89 01/19/2021   PLT 196 01/19/2021      Chemistry      Component Value Date/Time   NA 143 01/19/2021 1359   K 4.4 01/19/2021 1359   CL 102 01/19/2021 1359   CO2 24 01/19/2021 1359   BUN 11 01/19/2021 1359   CREATININE 0.97 01/19/2021 1359      Component Value Date/Time   CALCIUM 9.7 01/19/2021 1359   ALKPHOS 54 01/19/2021 1359   AST 30 01/19/2021 1359   ALT 33 01/19/2021 1359   BILITOT 0.6 01/19/2021 1359      No results found for: "TSH"   A/P: 1. Medication review: Patient currently on Rebif for the treatment of MS and is tolerating it well with no changes since last visit. Reviewed the medication with the patient, including the following: Rebif, interferon beta-1a, is an interferon indicated for the treatment of MS. Patient educated on purpose, proper use and potential adverse effects of Rebif. Analgesics and/or antipyretics may help decrease flu-like symptoms on treatment days. No recommendations for any changes.   Butch Penny, PharmD, [place], CPP Clinical Pharmacist [facility] [phone]

## 2022-10-16 ENCOUNTER — Other Ambulatory Visit: Payer: Self-pay

## 2022-10-16 ENCOUNTER — Other Ambulatory Visit (HOSPITAL_COMMUNITY): Payer: Self-pay

## 2022-10-18 ENCOUNTER — Other Ambulatory Visit: Payer: Self-pay

## 2022-10-24 DIAGNOSIS — Z800 Family history of malignant neoplasm of digestive organs: Secondary | ICD-10-CM | POA: Diagnosis not present

## 2022-10-24 DIAGNOSIS — G35 Multiple sclerosis: Secondary | ICD-10-CM | POA: Diagnosis not present

## 2022-10-24 DIAGNOSIS — Z1211 Encounter for screening for malignant neoplasm of colon: Secondary | ICD-10-CM | POA: Diagnosis not present

## 2022-11-02 ENCOUNTER — Other Ambulatory Visit (HOSPITAL_COMMUNITY): Payer: Self-pay

## 2022-11-07 ENCOUNTER — Other Ambulatory Visit (HOSPITAL_COMMUNITY): Payer: Self-pay

## 2022-11-09 ENCOUNTER — Other Ambulatory Visit (HOSPITAL_COMMUNITY): Payer: Self-pay

## 2022-11-10 ENCOUNTER — Other Ambulatory Visit: Payer: Self-pay

## 2022-12-11 ENCOUNTER — Other Ambulatory Visit: Payer: Self-pay

## 2022-12-11 ENCOUNTER — Other Ambulatory Visit (HOSPITAL_COMMUNITY): Payer: Self-pay

## 2022-12-12 ENCOUNTER — Other Ambulatory Visit: Payer: Self-pay

## 2022-12-12 ENCOUNTER — Other Ambulatory Visit (HOSPITAL_COMMUNITY): Payer: Self-pay

## 2022-12-14 ENCOUNTER — Other Ambulatory Visit (HOSPITAL_COMMUNITY): Payer: Self-pay

## 2022-12-14 MED ORDER — GOLYTELY 236 G PO SOLR
ORAL | 0 refills | Status: DC
  Filled 2022-12-14 – 2022-12-25 (×2): qty 4000, 1d supply, fill #0

## 2022-12-25 ENCOUNTER — Other Ambulatory Visit (HOSPITAL_COMMUNITY): Payer: Self-pay

## 2022-12-27 DIAGNOSIS — Z1211 Encounter for screening for malignant neoplasm of colon: Secondary | ICD-10-CM | POA: Diagnosis not present

## 2022-12-27 DIAGNOSIS — K648 Other hemorrhoids: Secondary | ICD-10-CM | POA: Diagnosis not present

## 2022-12-27 DIAGNOSIS — K635 Polyp of colon: Secondary | ICD-10-CM | POA: Diagnosis not present

## 2022-12-27 DIAGNOSIS — K621 Rectal polyp: Secondary | ICD-10-CM | POA: Diagnosis not present

## 2022-12-27 DIAGNOSIS — D124 Benign neoplasm of descending colon: Secondary | ICD-10-CM | POA: Diagnosis not present

## 2022-12-27 DIAGNOSIS — D123 Benign neoplasm of transverse colon: Secondary | ICD-10-CM | POA: Diagnosis not present

## 2022-12-27 DIAGNOSIS — D128 Benign neoplasm of rectum: Secondary | ICD-10-CM | POA: Diagnosis not present

## 2022-12-27 DIAGNOSIS — Z800 Family history of malignant neoplasm of digestive organs: Secondary | ICD-10-CM | POA: Diagnosis not present

## 2022-12-28 ENCOUNTER — Other Ambulatory Visit (HOSPITAL_COMMUNITY): Payer: Self-pay

## 2023-01-01 ENCOUNTER — Other Ambulatory Visit (HOSPITAL_COMMUNITY): Payer: Self-pay

## 2023-01-03 ENCOUNTER — Other Ambulatory Visit (HOSPITAL_COMMUNITY): Payer: Self-pay

## 2023-01-29 ENCOUNTER — Other Ambulatory Visit (HOSPITAL_COMMUNITY): Payer: Self-pay

## 2023-01-30 ENCOUNTER — Other Ambulatory Visit (HOSPITAL_COMMUNITY): Payer: Self-pay

## 2023-01-30 ENCOUNTER — Other Ambulatory Visit: Payer: Self-pay

## 2023-01-31 ENCOUNTER — Other Ambulatory Visit: Payer: Self-pay

## 2023-03-02 ENCOUNTER — Other Ambulatory Visit (HOSPITAL_COMMUNITY): Payer: Self-pay

## 2023-03-05 ENCOUNTER — Other Ambulatory Visit: Payer: Self-pay

## 2023-03-19 ENCOUNTER — Encounter: Payer: Self-pay | Admitting: Diagnostic Neuroimaging

## 2023-03-19 ENCOUNTER — Ambulatory Visit: Payer: 59 | Admitting: Diagnostic Neuroimaging

## 2023-03-19 VITALS — BP 126/79 | HR 47 | Ht 72.0 in | Wt 191.0 lb

## 2023-03-19 DIAGNOSIS — G35 Multiple sclerosis: Secondary | ICD-10-CM

## 2023-03-19 NOTE — Progress Notes (Signed)
[facility]  PATIENT: Bradley Nicholson DOB: 1961/06/10  REFERRING CLINICIAN:  HISTORY FROM: patient REASON FOR VISIT: follow up (former Dr. Sandria Manly patient)   HISTORICAL  CHIEF COMPLAINT:  Chief Complaint  Patient presents with   Follow-up    Pt alone, rm 6. Here for MS follow up. DMT Rebif. States overall stable no issues     HISTORY OF PRESENT ILLNESS:   UPDATE (03/19/23, VRP): Since last visit, doing well. Symptoms are stable. No alleviating or aggravating factors. Tolerating Rebif.  No new issues.  UPDATE (03/06/22, VRP): Since last visit, doing well. Symptoms are stable. Rebif stable.   UPDATE (01/19/21, VRP): Since last visit, doing well on rebif. No new issues. Symptoms are stable. No alleviating or aggravating factors. Tolerating meds.    UPDATE (12/08/19, VRP): Since last visit, doing well. Symptoms are stable. No alleviating or aggravating factors. Tolerating rebif. Also had covid vaccine x 2 doses; doing well.   UPDATE (12/02/18, VRP): Since last visit, doing well. Symptoms are stable. No alleviating or aggravating factors. Tolerating rebif.    UPDATE (08/01/17, VRP): Since last visit, doing well. Tolerating rebif. No alleviating or aggravating factors. No new issues.  UPDATE 07/27/16: Since last visit, doing well. Tolerating rebif. No skin site reactions.  UPDATE 07/22/15: Doing well. No new neurologic symptoms. Tolerating rebif. MRI scans and labs are stable.  UPDATE 12/30/14: Since last visit, doing about the same. No new neuro symptoms. Some intermittent cramps in hands. Tolerating rebif.  UPDATE 12/30/13: Since last visit patient doing well. No further neurologic symptoms. Patient is tolerating Rebif injections. No skin site reactions. I reviewed patient's initial symptoms in August 2006. Patient tells me that 2 weeks prior to onset of double vision 2006, patient had had palpitations, diagnosed with Wolff-Parkinson-White syndrome, and underwent cardiac  catheterization with radiofrequency ablation. When patient developed double vision, he had woke up with symptoms. Symptoms lasted approximately 2 days.  PRIOR HPI (02/23/12, Dr. Sandria Manly): "62 year old right-handed married African American male first seen 06/30/05 for dizziness and double vision with an abnormal MRI study of the brain  06/27/05 showing a 3-3.5 mm lesion in the brainstem midbrain on the left, adjacent to the aqueduct near the  superior collicus. CBC, CMP, ANA, lipid profile was normal except LDL 155. Examination revealed acuity at 20/20 right eye, 20/30 in the left and red lens showed vertical diplopia. LP 08/31/05 showed negative ACE  and VDRL with glucose 54, protein 33, no red blood cells, one white blood cell, and positive CSF oligoclonal banding with normal CSF IgG index. He began rebif on Mondays, Wednesdays, and Fridays 12/06. He has not had significant side effects from the medication. He denies bowel or bladder incontinence. He has no history of Lhermitte's sign. His memory has been good. He has no injection site sores. His last blood studies 06/03/10 were normal CBC and CMP. MRIs of the brain and cervical spine 09/14/2010 without and with contrast enhancement were normal except for degenerative changes at C4-5. 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 He exercises on a regular basis."    REVIEW OF SYSTEMS: Full 14 system review of systems performed and negative except: as per HPI.   ALLERGIES: No Known Allergies  HOME MEDICATIONS: Outpatient Medications Prior to Visit  Medication Sig Dispense Refill   Cholecalciferol (VITAMIN D) 2000 units tablet Take 2,000 Units by mouth daily.     interferon beta-1a (REBIF) 44 MCG/0.5ML SOSY injection INJECT 0.5 MLS (44 MCG  TOTAL) INTO THE SKIN 3 TIMES A WEEK. 6 mL 5   polyethylene glycol (GOLYTELY) 236 g solution Use as directed 4000 mL 0   predniSONE (DELTASONE) 10 MG tablet Take 6 tabs for 2 days; take 5 tabs for 2 days;  take 4 tab for 2 days; take 3 tabs for 2 days; take 2 tabs for 2 days and take 1 tab for 2 days 42 tablet 0   No facility-administered medications prior to visit.    PAST MEDICAL HISTORY: Past Medical History:  Diagnosis Date   Multiple sclerosis (HCC)     PAST SURGICAL HISTORY: Past Surgical History:  Procedure Laterality Date   CARDIAC CATHETERIZATION  2006    FAMILY HISTORY: Family History  Problem Relation Age of Onset   Healthy Mother     SOCIAL HISTORY:  Social History   Socioeconomic History   Marital status: Married    Spouse name: Lawson Fiscal   Number of children: 1   Years of education: Not on file   Highest education level: Not on file  Occupational History    Employer: [employer]  Tobacco Use   Smoking status: Never   Smokeless tobacco: Never  Substance and Sexual Activity   Alcohol use: Yes    Comment: 1 beverage  per week   Drug use: No   Sexual activity: Not on file  Other Topics Concern   Not on file  Social History Narrative   Patient lives at home with his spouse.   Caffeine Use: does consume   Social Determinants of Health   Financial Resource Strain: Not on file  Food Insecurity: Not on file  Transportation Needs: Not on file  Physical Activity: Not on file  Stress: Not on file  Social Connections: Not on file  Intimate Partner Violence: Not on file     PHYSICAL EXAM  Vitals:   03/19/23 1626  BP: 126/79  Pulse: (!) 47  Weight: 191 lb (86.6 kg)  Height: 6' (1.829 m)   Not recorded     Body mass index is 25.9 kg/m.  GENERAL EXAM: Patient is in no distress; well developed, nourished and groomed; neck is supple  CARDIOVASCULAR: Regular rate and rhythm, no murmurs, no carotid bruits  NEUROLOGIC: MENTAL STATUS: awake, alert, language fluent, comprehension intact, naming intact, fund of knowledge appropriate CRANIAL NERVE: pupils equal and reactive to light, visual fields full to confrontation, extraocular muscles intact,  MILD END GAZE NYSTAGMUS, facial sensation and strength symmetric, hearing intact, palate elevates symmetrically, uvula midline, shoulder shrug symmetric, tongue midline. MOTOR: normal bulk and tone, full strength in the BUE, BLE SENSORY: normal and symmetric to light touch, temperature, vibration COORDINATION: finger-nose-finger, fine finger movements normal REFLEXES: deep tendon reflexes present and symmetric GAIT/STATION: narrow based gait    DIAGNOSTIC DATA (LABS, IMAGING, TESTING) - I reviewed patient records, labs, notes, testing and imaging myself where available.   Lab Results  Component Value Date   WBC 4.7 01/19/2021   HGB 13.2 01/19/2021   HCT 39.0 01/19/2021   MCV 89 01/19/2021   PLT 196 01/19/2021      Latest Ref Rng & Units 01/19/2021    1:59 PM 12/02/2018    4:18 PM 08/01/2017    3:43 PM  CMP  Glucose 65 - 99 mg/dL 161  84  73   BUN 8 - 27 mg/dL 11  12  11    Creatinine 0.76 - 1.27 mg/dL 0.96  0.45  4.09   Sodium 134 - 144 mmol/L  143  147  143   Potassium 3.5 - 5.2 mmol/L 4.4  4.0  4.1   Chloride 96 - 106 mmol/L 102  105  103   CO2 20 - 29 mmol/L 24  27  27    Calcium 8.6 - 10.2 mg/dL 9.7  9.6  9.7   Total Protein 6.0 - 8.5 g/dL 7.2  6.7  6.7   Total Bilirubin 0.0 - 1.2 mg/dL 0.6  0.4  0.6   Alkaline Phos 44 - 121 IU/L 54  56  46   AST 0 - 40 IU/L 30  44  35   ALT 0 - 44 IU/L 33  47  39    Vit D, 25-Hydroxy  Date Value Ref Range Status  01/19/2021 33.8 30.0 - 100.0 ng/mL Final    Comment:    Vitamin D deficiency has been defined by the Institute of Medicine and an Endocrine Society practice guideline as a level of serum 25-OH vitamin D less than 20 ng/mL (1,2). The Endocrine Society went on to further define vitamin D insufficiency as a level between 21 and 29 ng/mL (2). 1. IOM (Institute of Medicine). 2010. Dietary reference    intakes for calcium and D. [place]: The    [employer]. 2. Holick MF, Binkley Wilcox, Bischoff-Ferrari HA, et  al.    Evaluation, treatment, and prevention of vitamin D    deficiency: an Endocrine Society clinical practice    guideline. JCEM. 2011 Jul; 96(7):1911-30.   12/02/2018 7.6 (L) 30.0 - 100.0 ng/mL Final    Comment:    Vitamin D deficiency has been defined by the Institute of Medicine and an Endocrine Society practice guideline as a level of serum 25-OH vitamin D less than 20 ng/mL (1,2). The Endocrine Society went on to further define vitamin D insufficiency as a level between 21 and 29 ng/mL (2). 1. IOM (Institute of Medicine). 2010. Dietary reference    intakes for calcium and D. [place]: The    [employer]. 2. Holick MF, Binkley [place], Bischoff-Ferrari HA, et al.    Evaluation, treatment, and prevention of vitamin D    deficiency: an Endocrine Society clinical practice    guideline. JCEM. 2011 Jul; 96(7):1911-30.   08/01/2017 14.6 (L) 30.0 - 100.0 ng/mL Final    Comment:    Vitamin D deficiency has been defined by the Institute of Medicine and an Endocrine Society practice guideline as a level of serum 25-OH vitamin D less than 20 ng/mL (1,2). The Endocrine Society went on to further define vitamin D insufficiency as a level between 21 and 29 ng/mL (2). 1. IOM (Institute of Medicine). 2010. Dietary reference    intakes for calcium and D. [place]: The    [employer]. 2. Holick MF, Binkley [place], Bischoff-Ferrari HA, et al.    Evaluation, treatment, and prevention of vitamin D    deficiency: an Endocrine Society clinical practice    guideline. JCEM. 2011 Jul; 96(7):1911-30.    No results found for: "CHOL", "HDL", "LDLCALC", "LDLDIRECT", "TRIG", "CHOLHDL" No results found for: "HGBA1C" No results found for: "VITAMINB12" No results found for: "TSH"    09/15/10 MRI brain - (report states normal brain); However I reviewed images myself and there is a subtle T2 hyperintense lesion in the midbrain on the left adjacent to the aqueduct, near the  superior colliculus and fourth cranial nerve nucleus. Stable from prior studies. No abnl enhancement. -VRP  09/15/10 MRI cervical spine: 1. Stable, normal MRI  appearance of the cervical and visualized thoracic spinal cord.  2. No acute findings in the cervical spine. Cervical degenerative changes, maximal at C4-C5.  01/06/15 MRI brain (with and without) demonstrating: 1. Subtle, stable T2 hyperintense lesion near the peri-aqueductal gray on the left side. No abnormal lesions are seen on post contrast views. May represent chronic demyelinating plaque or other non-specific autoimmune/inflammatory etiology. 2. No significant change from MRI on 09/14/10.   01/06/15 MRI cervical spine (with and without) demonstrating: 1. At C4-5: disc bulging and uncovertebral joint hypertrophy with moderate biforaminal stenosis. 2. No intrinsic or abnormal enhancing spinal cord lesions. 3. No significant change from MRI on 09/14/10.     ASSESSMENT AND PLAN  62 y.o. year old male here with multiple sclerosis since 2006. Symptoms stable on rebif.   Dx:  MS (multiple sclerosis) (HCC)    PLAN:  MULTIPLE SCLEROSIS (stable over many years; MRIs show minimal changes; may consider to taper off rebif in future given mild course and age) - continue rebif; stable; check labs per PCP - contnue vitamin D supplement  Return in about 1 year (around 03/18/2024).    Suanne Marker, MD 03/19/2023, 4:42 PM Certified in Neurology, Neurophysiology and Neuroimaging  [facility] [address], [address] [place], [place] [address] [phone]

## 2023-03-29 ENCOUNTER — Other Ambulatory Visit: Payer: Self-pay

## 2023-03-30 ENCOUNTER — Other Ambulatory Visit (HOSPITAL_COMMUNITY): Payer: Self-pay

## 2023-04-27 ENCOUNTER — Other Ambulatory Visit (HOSPITAL_COMMUNITY): Payer: Self-pay

## 2023-04-30 ENCOUNTER — Other Ambulatory Visit (HOSPITAL_COMMUNITY): Payer: Self-pay

## 2023-05-01 ENCOUNTER — Other Ambulatory Visit (HOSPITAL_COMMUNITY): Payer: Self-pay

## 2023-05-08 ENCOUNTER — Telehealth: Payer: Self-pay

## 2023-05-08 ENCOUNTER — Other Ambulatory Visit (HOSPITAL_COMMUNITY): Payer: Self-pay

## 2023-05-08 NOTE — Telephone Encounter (Signed)
Patient Advocate Encounter   Received notification from [employer] that prior authorization is required for Rebif 44MCG/0.5ML syringes   Submitted: 05-08-2023 Key [id number]  Status is pending

## 2023-05-14 ENCOUNTER — Other Ambulatory Visit (HOSPITAL_COMMUNITY): Payer: Self-pay

## 2023-05-14 NOTE — Telephone Encounter (Signed)
Pharmacy Patient Advocate Encounter  Prior Authorization for Rebif 44MCG/0.5ML syringes has been APPROVED by [employer] from 05/10/2023 to 05/08/2024.   PA # PA Case ID #: [id number]

## 2023-05-16 DIAGNOSIS — L309 Dermatitis, unspecified: Secondary | ICD-10-CM | POA: Diagnosis not present

## 2023-05-22 DIAGNOSIS — L309 Dermatitis, unspecified: Secondary | ICD-10-CM | POA: Diagnosis not present

## 2023-05-25 ENCOUNTER — Other Ambulatory Visit (HOSPITAL_COMMUNITY): Payer: Self-pay

## 2023-05-28 ENCOUNTER — Other Ambulatory Visit (HOSPITAL_COMMUNITY): Payer: Self-pay

## 2023-06-04 ENCOUNTER — Other Ambulatory Visit (HOSPITAL_COMMUNITY): Payer: Self-pay

## 2023-06-04 ENCOUNTER — Other Ambulatory Visit: Payer: Self-pay | Admitting: Pharmacist

## 2023-06-04 ENCOUNTER — Other Ambulatory Visit: Payer: Self-pay

## 2023-06-04 ENCOUNTER — Telehealth: Payer: Self-pay | Admitting: Pharmacist

## 2023-06-04 MED ORDER — REBIF 44 MCG/0.5ML ~~LOC~~ SOSY
PREFILLED_SYRINGE | SUBCUTANEOUS | 3 refills | Status: DC
  Filled 2023-06-04: qty 6, 28d supply, fill #0
  Filled 2023-06-29: qty 6, 28d supply, fill #1
  Filled 2023-07-18: qty 6, 28d supply, fill #2
  Filled 2023-08-16: qty 6, 28d supply, fill #3

## 2023-06-04 MED ORDER — REBIF 44 MCG/0.5ML ~~LOC~~ SOSY: PREFILLED_SYRINGE | SUBCUTANEOUS | 3 refills | Status: DC

## 2023-06-04 NOTE — Telephone Encounter (Signed)
Called patient to schedule an appointment for the [employer] [facility]. I was unable to reach the patient so I left a HIPAA-compliant message requesting that the patient return my call.   Luke Van Ausdall, PharmD, BCACP, CPP Clinical Pharmacist [facility] [phone]  

## 2023-06-05 ENCOUNTER — Other Ambulatory Visit: Payer: Self-pay

## 2023-06-29 ENCOUNTER — Other Ambulatory Visit (HOSPITAL_COMMUNITY): Payer: Self-pay

## 2023-07-18 ENCOUNTER — Other Ambulatory Visit (HOSPITAL_COMMUNITY): Payer: Self-pay

## 2023-07-25 ENCOUNTER — Other Ambulatory Visit: Payer: Self-pay

## 2023-07-28 ENCOUNTER — Encounter (HOSPITAL_COMMUNITY): Payer: Self-pay

## 2023-08-16 ENCOUNTER — Other Ambulatory Visit: Payer: Self-pay

## 2023-08-16 NOTE — Progress Notes (Signed)
Specialty Pharmacy Refill Coordination Note  Bradley Nicholson is a 62 y.o. male contacted today regarding refills of specialty medication(s) Interferon Beta-1a   Patient requested Delivery   Delivery date: 08/24/23   Verified address: [address], [address]   Medication will be filled on 08/23/23.

## 2023-08-23 ENCOUNTER — Other Ambulatory Visit (HOSPITAL_COMMUNITY): Payer: Self-pay

## 2023-09-14 ENCOUNTER — Other Ambulatory Visit: Payer: Self-pay

## 2023-09-14 ENCOUNTER — Encounter (HOSPITAL_COMMUNITY): Payer: Self-pay

## 2023-09-14 NOTE — Progress Notes (Signed)
Specialty Pharmacy Ongoing Clinical Assessment Note  Bradley Nicholson is a 63 y.o. male who is being followed by the specialty pharmacy service for RxSp Multiple Sclerosis   Patient's specialty medication(s) reviewed today: Interferon Beta-1a   Missed doses in the last 4 weeks: 0   Patient/Caregiver did not have any additional questions or concerns.   Therapeutic benefit summary: Unable to assess   Adverse events/side effects summary: No adverse events/side effects   Patient's therapy is appropriate to: Continue    Goals Addressed             This Visit's Progress    Stabilization of disease       Patient is on track. Patient will maintain adherence         Follow up:  6 months  Bobette Mo Specialty Pharmacist

## 2023-09-14 NOTE — Progress Notes (Signed)
Specialty Pharmacy Refill Coordination Note  Bradley Nicholson is a 62 y.o. male contacted today regarding refills of specialty medication(s) Interferon Beta-1a   Patient requested Delivery   Delivery date: 09/20/23   Verified address: [address]   Medication will be filled on 09/19/23. Pending Refill Request.

## 2023-09-17 ENCOUNTER — Other Ambulatory Visit: Payer: Self-pay

## 2023-09-17 ENCOUNTER — Telehealth: Payer: Self-pay | Admitting: Pharmacist

## 2023-09-17 MED ORDER — REBIF 44 MCG/0.5ML ~~LOC~~ SOSY: PREFILLED_SYRINGE | SUBCUTANEOUS | 3 refills | Status: DC

## 2023-09-17 NOTE — Telephone Encounter (Signed)
Called patient to schedule an appointment for the [employer] [facility]. I was unable to reach the patient so I left a HIPAA-compliant message requesting that the patient return my call.   Luke Van Ausdall, PharmD, BCACP, CPP Clinical Pharmacist [facility] [phone]  

## 2023-09-21 ENCOUNTER — Other Ambulatory Visit: Payer: Self-pay

## 2023-09-21 NOTE — Progress Notes (Signed)
Bradley Nicholson has been trying to reach out to the patient. Luke and I both left voicemails for the patient to reach back out to [place] at [phone].

## 2023-09-24 ENCOUNTER — Other Ambulatory Visit (HOSPITAL_COMMUNITY): Payer: Self-pay

## 2023-10-03 ENCOUNTER — Other Ambulatory Visit: Payer: Self-pay

## 2023-10-09 ENCOUNTER — Other Ambulatory Visit (HOSPITAL_COMMUNITY): Payer: Self-pay

## 2023-10-09 ENCOUNTER — Other Ambulatory Visit: Payer: Self-pay | Admitting: Pharmacist

## 2023-10-09 ENCOUNTER — Other Ambulatory Visit (HOSPITAL_COMMUNITY): Payer: Self-pay | Admitting: Pharmacy Technician

## 2023-10-09 ENCOUNTER — Ambulatory Visit: Payer: Commercial Managed Care - PPO | Attending: Internal Medicine | Admitting: Pharmacist

## 2023-10-09 ENCOUNTER — Other Ambulatory Visit: Payer: Self-pay

## 2023-10-09 DIAGNOSIS — Z7189 Other specified counseling: Secondary | ICD-10-CM

## 2023-10-09 MED ORDER — REBIF 44 MCG/0.5ML ~~LOC~~ SOSY
PREFILLED_SYRINGE | SUBCUTANEOUS | 3 refills | Status: DC
  Filled 2023-10-09: qty 6, 28d supply, fill #0
  Filled 2023-11-05 – 2023-12-24 (×3): qty 6, 28d supply, fill #1
  Filled 2024-02-01: qty 6, 28d supply, fill #2
  Filled 2024-02-27 (×2): qty 6, 28d supply, fill #3

## 2023-10-09 NOTE — Progress Notes (Signed)
Patient need visited with Bradley Nicholson for the month of November & was waiting for re-write. Was able to reach patient today 10/09/23 will create a new out reach.

## 2023-10-09 NOTE — Progress Notes (Signed)
Specialty Pharmacy Refill Coordination Note  Bradley Nicholson is a 62 y.o. male contacted today regarding refills of specialty medication(s) Interferon Beta-1a   Patient requested Delivery   Delivery date: 10/12/23   Verified address: [address]  [place]   Medication will be filled on 10/11/23.  Franky Macho has sent re-write to pharmacy

## 2023-10-09 NOTE — Progress Notes (Signed)
Please see OV from 10/09/23 for complete documentation.  Butch Penny, PharmD, Patsy [place], CPP Clinical Pharmacist [facility] [phone]

## 2023-10-09 NOTE — Progress Notes (Signed)
   S:  Patient presents for review of their specialty medication therapy.  Patient is currently taking Rebif for multiple sclerosis (MS). Patient is managed by Dr. Marjory Lies for this.   Adherence: denies any missed doses  Efficacy: patient is pleased with results   Dosing: Rebif (subQ): 44 mcg 3 times weekly (separate doses by at least 48 hours)  Drug-drug interactions: none  Monitoring: CBC: last one 01/2021, WNL LFTs: last one 01/2021, WNL Thyroid function tests: no recent tests on file Injection site reactions: none S/sx of malignancy: none Neuropsychiatric symptoms: none Thrombotic microangiopathy (monitor for new onset HTN, thrombocytopenia, or impaired renal dysfunction): none  O:  Lab Results  Component Value Date   WBC 4.7 01/19/2021   HGB 13.2 01/19/2021   HCT 39.0 01/19/2021   MCV 89 01/19/2021   PLT 196 01/19/2021      Chemistry      Component Value Date/Time   NA 143 01/19/2021 1359   K 4.4 01/19/2021 1359   CL 102 01/19/2021 1359   CO2 24 01/19/2021 1359   BUN 11 01/19/2021 1359   CREATININE 0.97 01/19/2021 1359      Component Value Date/Time   CALCIUM 9.7 01/19/2021 1359   ALKPHOS 54 01/19/2021 1359   AST 30 01/19/2021 1359   ALT 33 01/19/2021 1359   BILITOT 0.6 01/19/2021 1359      No results found for: "TSH"   A/P: 1. Medication review: Patient currently on Rebif for the treatment of MS and is tolerating it well with no changes since last visit. Reviewed the medication with the patient, including the following: Rebif, interferon beta-1a, is an interferon indicated for the treatment of MS. Patient educated on purpose, proper use and potential adverse effects of Rebif. Analgesics and/or antipyretics may help decrease flu-like symptoms on treatment days. No recommendations for any changes.   Butch Penny, PharmD, [place], CPP Clinical Pharmacist [facility] [phone]

## 2023-10-11 ENCOUNTER — Other Ambulatory Visit: Payer: Self-pay

## 2023-10-30 ENCOUNTER — Other Ambulatory Visit: Payer: Self-pay

## 2023-11-01 ENCOUNTER — Other Ambulatory Visit: Payer: Self-pay

## 2023-11-05 ENCOUNTER — Other Ambulatory Visit: Payer: Self-pay

## 2023-11-05 NOTE — Progress Notes (Signed)
Specialty Pharmacy Refill Coordination Note  Bradley Nicholson is a 62 y.o. male contacted today regarding refills of specialty medication(s) Interferon Beta-1a (Rebif)   Patient requested Delivery   Delivery date: 11/09/23   Verified address: [address]  [place] [address]   Medication will be filled on 11/08/23.

## 2023-11-08 ENCOUNTER — Other Ambulatory Visit: Payer: Self-pay

## 2023-11-08 ENCOUNTER — Other Ambulatory Visit (HOSPITAL_COMMUNITY): Payer: Self-pay

## 2023-11-08 NOTE — Progress Notes (Signed)
 Pharmacy Patient Advocate Encounter   Received notification from Patient Pharmacy that prior authorization for Rebif  is required/requested.   Insurance verification completed.   The patient is insured through [facility] .   Per test claim: PA required; PA submitted to above mentioned insurance via CoverMyMeds Key/confirmation #/EOC [id number] Status is pending

## 2023-11-09 ENCOUNTER — Other Ambulatory Visit: Payer: Self-pay

## 2023-11-09 ENCOUNTER — Other Ambulatory Visit (HOSPITAL_COMMUNITY): Payer: Self-pay

## 2023-11-09 NOTE — Progress Notes (Signed)
 Pharmacy Patient Advocate Encounter  Received notification from [facility] that Prior Authorization for Rebif has been APPROVED from 05/10/23 to 05/08/24   PA #/Case ID/Reference #: [id number]

## 2023-11-12 ENCOUNTER — Other Ambulatory Visit: Payer: Self-pay

## 2023-11-12 DIAGNOSIS — R001 Bradycardia, unspecified: Secondary | ICD-10-CM | POA: Diagnosis not present

## 2023-11-12 DIAGNOSIS — Z0001 Encounter for general adult medical examination with abnormal findings: Secondary | ICD-10-CM | POA: Diagnosis not present

## 2023-11-12 DIAGNOSIS — G35 Multiple sclerosis: Secondary | ICD-10-CM | POA: Diagnosis not present

## 2023-11-12 DIAGNOSIS — R351 Nocturia: Secondary | ICD-10-CM | POA: Diagnosis not present

## 2023-11-12 DIAGNOSIS — E7800 Pure hypercholesterolemia, unspecified: Secondary | ICD-10-CM | POA: Diagnosis not present

## 2023-11-14 ENCOUNTER — Other Ambulatory Visit (HOSPITAL_COMMUNITY): Payer: Self-pay

## 2023-11-14 MED ORDER — ATORVASTATIN CALCIUM 20 MG PO TABS
20.0000 mg | ORAL_TABLET | Freq: Every day | ORAL | 3 refills | Status: AC
  Filled 2023-11-14: qty 90, 90d supply, fill #0

## 2023-11-22 ENCOUNTER — Other Ambulatory Visit: Payer: Self-pay

## 2023-11-27 ENCOUNTER — Other Ambulatory Visit: Payer: Self-pay

## 2023-11-27 NOTE — Progress Notes (Signed)
Specialty Pharmacy Refill Coordination Note  Bradley Nicholson is a 63 y.o. male contacted today regarding refills of specialty medication(s) Interferon Beta-1a (Rebif)   Patient requested Delivery   Delivery date: 12/05/23   Verified address: [address]  [place] [address]   Medication will be filled on 01.28.25.

## 2023-11-29 ENCOUNTER — Other Ambulatory Visit: Payer: Self-pay

## 2023-11-29 ENCOUNTER — Other Ambulatory Visit (HOSPITAL_COMMUNITY): Payer: Self-pay

## 2023-11-29 NOTE — Progress Notes (Addendum)
11/29/23 CMA: Rebif  Patient called to give [place] Rebif debit card. Updated card information and note in [place].

## 2023-12-04 ENCOUNTER — Other Ambulatory Visit: Payer: Self-pay

## 2023-12-04 NOTE — Progress Notes (Signed)
12/04/23 CA: Rebif  Left voicemail. Rebif debit card declined. Verify debit card information with patient.

## 2023-12-21 ENCOUNTER — Other Ambulatory Visit: Payer: Self-pay

## 2023-12-21 NOTE — Progress Notes (Signed)
call cannot be completed - we have attempted to fill medication twice since jan and are unable due to copay issues that the pt will need to resolve. medication will be returned to stock and specialty pharmacy will make no more attempts to fill until pt f/u

## 2023-12-24 ENCOUNTER — Other Ambulatory Visit: Payer: Self-pay

## 2023-12-24 ENCOUNTER — Other Ambulatory Visit (HOSPITAL_COMMUNITY): Payer: Self-pay

## 2023-12-24 NOTE — Progress Notes (Signed)
Specialty Pharmacy Refill Coordination Note  Bradley Nicholson is a 63 y.o. male contacted today regarding refills of specialty medication(s) Interferon Beta-1a (Rebif)   Patient requested Delivery   Delivery date: 01/01/24   Verified address: [address]   [place] [address]   Medication will be filled on 12/31/23.

## 2023-12-31 ENCOUNTER — Other Ambulatory Visit: Payer: Self-pay

## 2023-12-31 ENCOUNTER — Other Ambulatory Visit (HOSPITAL_COMMUNITY): Payer: Self-pay

## 2023-12-31 NOTE — Progress Notes (Signed)
 12/31/23-CA: Rebif  Patient will call back with new Rebif debit card information when he is home.

## 2024-01-01 ENCOUNTER — Other Ambulatory Visit (HOSPITAL_COMMUNITY): Payer: Self-pay

## 2024-01-02 ENCOUNTER — Other Ambulatory Visit: Payer: Self-pay

## 2024-01-04 ENCOUNTER — Other Ambulatory Visit (HOSPITAL_COMMUNITY): Payer: Self-pay

## 2024-01-04 ENCOUNTER — Other Ambulatory Visit: Payer: Self-pay

## 2024-01-07 ENCOUNTER — Other Ambulatory Visit (HOSPITAL_COMMUNITY): Payer: Self-pay

## 2024-01-07 ENCOUNTER — Other Ambulatory Visit: Payer: Self-pay

## 2024-01-08 ENCOUNTER — Other Ambulatory Visit (HOSPITAL_COMMUNITY): Payer: Self-pay

## 2024-01-10 ENCOUNTER — Other Ambulatory Visit (HOSPITAL_COMMUNITY): Payer: Self-pay

## 2024-01-24 ENCOUNTER — Other Ambulatory Visit: Payer: Self-pay

## 2024-01-30 ENCOUNTER — Other Ambulatory Visit: Payer: Self-pay

## 2024-02-01 ENCOUNTER — Other Ambulatory Visit: Payer: Self-pay

## 2024-02-01 ENCOUNTER — Other Ambulatory Visit: Payer: Self-pay | Admitting: Pharmacy Technician

## 2024-02-01 NOTE — Progress Notes (Signed)
 Specialty Pharmacy Refill Coordination Note  Bradley Nicholson is a 63 y.o. male contacted today regarding refills of specialty medication(s) Interferon Beta-1a (Rebif)   Patient requested Delivery   Delivery date: 02/07/24   Verified address: [address]  [place]   Medication will be filled on 02/06/24.

## 2024-02-26 ENCOUNTER — Other Ambulatory Visit: Payer: Self-pay

## 2024-02-27 ENCOUNTER — Other Ambulatory Visit (HOSPITAL_COMMUNITY): Payer: Self-pay

## 2024-02-27 ENCOUNTER — Other Ambulatory Visit: Payer: Self-pay

## 2024-02-27 NOTE — Progress Notes (Signed)
 Specialty Pharmacy Refill Coordination Note  Bradley Nicholson is a 63 y.o. male contacted today regarding refills of specialty medication(s) Interferon Beta-1a  (Rebif )   Patient requested Delivery   Delivery date: 03/04/24   Verified address: [address]   [place] [address]   Medication will be filled on 03/03/24.

## 2024-03-24 ENCOUNTER — Ambulatory Visit: Payer: 59 | Admitting: Diagnostic Neuroimaging

## 2024-03-24 ENCOUNTER — Other Ambulatory Visit: Payer: Self-pay

## 2024-03-26 ENCOUNTER — Other Ambulatory Visit: Payer: Self-pay

## 2024-03-28 ENCOUNTER — Other Ambulatory Visit (HOSPITAL_COMMUNITY): Payer: Self-pay

## 2024-04-02 ENCOUNTER — Other Ambulatory Visit: Payer: Self-pay

## 2024-04-04 ENCOUNTER — Other Ambulatory Visit: Payer: Self-pay

## 2024-04-09 ENCOUNTER — Other Ambulatory Visit: Payer: Self-pay

## 2024-04-28 ENCOUNTER — Other Ambulatory Visit: Payer: Self-pay | Admitting: Internal Medicine

## 2024-04-28 ENCOUNTER — Other Ambulatory Visit: Payer: Self-pay

## 2024-04-28 ENCOUNTER — Other Ambulatory Visit (HOSPITAL_COMMUNITY): Payer: Self-pay

## 2024-04-28 ENCOUNTER — Other Ambulatory Visit: Payer: Self-pay | Admitting: Pharmacist

## 2024-04-28 ENCOUNTER — Telehealth: Payer: Self-pay

## 2024-04-28 MED ORDER — INTERFERON BETA-1A 44 MCG/0.5ML ~~LOC~~ SOSY
PREFILLED_SYRINGE | SUBCUTANEOUS | 0 refills | Status: DC
  Filled 2024-04-28: qty 6, fill #0

## 2024-04-28 MED ORDER — REBIF 44 MCG/0.5ML ~~LOC~~ SOSY
PREFILLED_SYRINGE | SUBCUTANEOUS | 0 refills | Status: DC
  Filled 2024-04-28: qty 6, 28d supply, fill #0

## 2024-04-28 NOTE — Progress Notes (Signed)
 Specialty Pharmacy Ongoing Clinical Assessment Note  Bradley Nicholson is a 63 y.o. male who is being followed by the specialty pharmacy service for RxSp Multiple Sclerosis   Patient's specialty medication(s) reviewed today: Interferon Beta-1a  (Rebif )   Missed doses in the last 4 weeks: 0   Patient/Caregiver did not have any additional questions or concerns.   Therapeutic benefit summary: Patient is achieving benefit   Adverse events/side effects summary: No adverse events/side effects   Patient's therapy is appropriate to: Continue    Goals Addressed             This Visit's Progress    Stabilization of disease   On track    Patient is on track. Patient will maintain adherence         Follow up: 12 months  Egan Berkheimer M Reynaldo Rossman Specialty Pharmacist

## 2024-04-28 NOTE — Progress Notes (Signed)
 Specialty Pharmacy Refill Coordination Note  Spoke with Bradley Nicholson, Bradley Nicholson (Self).   Bradley Nicholson is a 63 y.o. male contacted today regarding refills of specialty medication(s) Interferon Beta-1a  (Rebif )  Injection date: 05/05/24.   Patient requested: Delivery   Delivery date: 05/01/24   Verified address: [address]   [place]  [address]  Medication will be filled on 04/30/24.  This fill date is pending response to refill request from provider. Patient is aware and if they have not received fill by intended date, they must follow up with pharmacy.

## 2024-04-28 NOTE — Telephone Encounter (Signed)
 Pharmacy Patient Advocate Encounter   Received notification from CoverMyMeds that prior authorization for Rebif  44MCG/0.5ML syringes is due for renewal.   Insurance verification completed.   The patient is insured through [facility].  Action: Patient hasn't been seen in your office in over a year. Plan requires updated chart notes for PA renewal.

## 2024-04-29 ENCOUNTER — Other Ambulatory Visit: Payer: Self-pay

## 2024-04-30 NOTE — Telephone Encounter (Signed)
 Patient missed last appt.  Please call patient and let him know we must see him asap in order to do prior auth for his Rebif . Please offer 7/31 at [phone] or [phone] with Dr Margaret.

## 2024-04-30 NOTE — Telephone Encounter (Signed)
 Thank you :)

## 2024-05-23 ENCOUNTER — Other Ambulatory Visit: Payer: Self-pay | Admitting: Pharmacist

## 2024-05-23 ENCOUNTER — Other Ambulatory Visit: Payer: Self-pay

## 2024-05-23 ENCOUNTER — Other Ambulatory Visit: Payer: Self-pay | Admitting: Pharmacy Technician

## 2024-05-23 MED ORDER — REBIF 44 MCG/0.5ML ~~LOC~~ SOSY
PREFILLED_SYRINGE | SUBCUTANEOUS | 2 refills | Status: DC
  Filled 2024-05-23 – 2024-05-27 (×2): qty 6, 28d supply, fill #0
  Filled 2024-07-14: qty 6, 28d supply, fill #1
  Filled 2024-08-07: qty 6, 28d supply, fill #2

## 2024-05-23 MED ORDER — INTERFERON BETA-1A 44 MCG/0.5ML ~~LOC~~ SOSY
PREFILLED_SYRINGE | SUBCUTANEOUS | 2 refills | Status: DC
  Filled 2024-05-23: qty 6, fill #0

## 2024-05-23 NOTE — Progress Notes (Signed)
 Specialty Pharmacy Refill Coordination Note  Bradley Nicholson is a 63 y.o. male contacted today regarding refills of specialty medication(s) Interferon Beta-1a  (Rebif )   Patient requested Delivery   Delivery date: 05/28/24   Verified address: [address]  [place]   Medication will be filled on 05/27/24.  RR sent to [place] MD; Please send to [facility] for re-write  This fill date is pending response to refill request from provider. Patient is aware and if they have not received fill by intended date they must follow up with pharmacy.

## 2024-05-27 ENCOUNTER — Other Ambulatory Visit: Payer: Self-pay

## 2024-05-27 NOTE — Progress Notes (Signed)
 7.22 Medication refill is cancelled.    Per chart on 04/30/24 10:58 AM pt needs appt for prior authorization.   Patient is aware of this and schedule appointment on 06/12/24 at 3:00 pm.  Check chart for prior authorization completed after appointment on 06/12/24 before refilling.   Routed to [facility] for benefits investigation.

## 2024-06-05 ENCOUNTER — Other Ambulatory Visit: Payer: Self-pay

## 2024-06-11 ENCOUNTER — Other Ambulatory Visit: Payer: Self-pay

## 2024-06-11 ENCOUNTER — Other Ambulatory Visit (HOSPITAL_COMMUNITY): Payer: Self-pay

## 2024-06-12 ENCOUNTER — Encounter: Payer: Self-pay | Admitting: Diagnostic Neuroimaging

## 2024-06-12 ENCOUNTER — Ambulatory Visit (INDEPENDENT_AMBULATORY_CARE_PROVIDER_SITE_OTHER): Admitting: Diagnostic Neuroimaging

## 2024-06-12 VITALS — BP 141/86 | HR 67 | Ht 72.0 in | Wt 197.0 lb

## 2024-06-12 DIAGNOSIS — G35 Multiple sclerosis: Secondary | ICD-10-CM

## 2024-06-12 NOTE — Progress Notes (Signed)
 [facility]  Bradley Nicholson: Bradley Nicholson DOB: [birth year]  REFERRING CLINICIAN: Valma Carwin, MD  HISTORY FROM: Bradley Nicholson REASON FOR VISIT: follow up (former Dr. Maurice Bradley Nicholson)   HISTORICAL  CHIEF COMPLAINT:  Chief Complaint  Bradley Nicholson presents with   Multiple Sclerosis    Rm 6 alone Pt is well and stable, reports no MS concerns     HISTORY OF PRESENT ILLNESS:   UPDATE (06/12/24, VRP): Since last visit, doing well. Symptoms are stable. No alleviating or aggravating factors. Tolerating rebif .  UPDATE (03/19/23, VRP): Since last visit, doing well. Symptoms are stable. No alleviating or aggravating factors. Tolerating Rebif .  No new issues.  UPDATE (03/06/22, VRP): Since last visit, doing well. Symptoms are stable. Rebif  stable.   UPDATE (01/19/21, VRP): Since last visit, doing well on rebif . No new issues. Symptoms are stable. No alleviating or aggravating factors. Tolerating meds.    UPDATE (12/08/19, VRP): Since last visit, doing well. Symptoms are stable. No alleviating or aggravating factors. Tolerating rebif . Also had covid vaccine x 2 doses; doing well.   UPDATE (12/02/18, VRP): Since last visit, doing well. Symptoms are stable. No alleviating or aggravating factors. Tolerating rebif .    UPDATE (08/01/17, VRP): Since last visit, doing well. Tolerating rebif . No alleviating or aggravating factors. No new issues.  UPDATE 07/27/16: Since last visit, doing well. Tolerating rebif . No skin site reactions.  UPDATE 07/22/15: Doing well. No new neurologic symptoms. Tolerating rebif . MRI scans and labs are stable.  UPDATE 12/30/14: Since last visit, doing about the same. No new neuro symptoms. Some intermittent cramps in hands. Tolerating rebif .  UPDATE 12/30/13: Since last visit Bradley Nicholson doing well. No further neurologic symptoms. Bradley Nicholson is tolerating Rebif  injections. No skin site reactions. I reviewed Bradley Nicholson's initial symptoms in August 2006. Bradley Nicholson tells me that 2 weeks prior  to onset of double vision 2006, Bradley Nicholson had had palpitations, diagnosed with Wolff-Parkinson-White syndrome, and underwent cardiac catheterization with radiofrequency ablation. When Bradley Nicholson developed double vision, he had woke up with symptoms. Symptoms lasted approximately 2 days.  PRIOR HPI (02/23/12, Dr. Maurice): 63 year old right-handed married African American male first seen 06/30/05 for dizziness and double vision with an abnormal MRI study of the brain  06/27/05 showing a 3-3.5 mm lesion in the brainstem midbrain on the left, adjacent to the aqueduct near the  superior collicus. CBC, CMP, ANA, lipid profile was normal except LDL 155. Examination revealed acuity at 20/20 right eye, 20/30 in the left and red lens showed vertical diplopia. LP 08/31/05 showed negative ACE  and VDRL with glucose 54, protein 33, no red blood cells, one white blood cell, and positive CSF oligoclonal banding with normal CSF IgG index. He began rebif  on Mondays, Wednesdays, and Fridays 12/06. He has not had significant side effects from the medication. He denies bowel or bladder incontinence. He has no history of Lhermitte's sign. His memory has been good. He has no injection site sores. His last blood studies 06/03/10 were normal CBC and CMP. MRIs of the brain and cervical spine 09/14/2010 without and with contrast enhancement were normal except for degenerative changes at C4-5. 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 He exercises on a regular basis.    REVIEW OF SYSTEMS: Full 14 system review of systems performed and negative except: as per HPI.   ALLERGIES: No Known Allergies  HOME MEDICATIONS: Outpatient Medications Prior to Visit  Medication Sig Dispense Refill   atorvastatin  (LIPITOR) 20 MG tablet Take 1 tablet (20  mg total) by mouth daily. 90 tablet 3   Cholecalciferol (VITAMIN D ) 2000 units tablet Take 2,000 Units by mouth daily.     interferon beta-1a  (REBIF ) 44 MCG/0.5ML SOSY injection  Inject 0.5 milliliters by subcutaneous route three times every week at the same time on the same 3 days at least 48 hours apart. 6 mL 2   No facility-administered medications prior to visit.    PAST MEDICAL HISTORY: Past Medical History:  Diagnosis Date   Multiple sclerosis (HCC)     PAST SURGICAL HISTORY: Past Surgical History:  Procedure Laterality Date   CARDIAC CATHETERIZATION  2006    FAMILY HISTORY: Family History  Problem Relation Age of Onset   Healthy Mother     SOCIAL HISTORY:  Social History   Socioeconomic History   Marital status: Married    Spouse name: Katheryn   Number of children: 1   Years of education: Not on file   Highest education level: Not on file  Occupational History    Employer: [employer]  Tobacco Use   Smoking status: Never   Smokeless tobacco: Never  Substance and Sexual Activity   Alcohol use: Yes    Comment: 1 beverage  per week   Drug use: No   Sexual activity: Not on file  Other Topics Concern   Not on file  Social History Narrative   Bradley Nicholson lives at home with his spouse.   Caffeine Use: does consume   Social Drivers of Corporate investment banker Strain: Not on file  Food Insecurity: Not on file  Transportation Needs: Not on file  Physical Activity: Not on file  Stress: Not on file  Social Connections: Not on file  Intimate Partner Violence: Not on file     PHYSICAL EXAM  Vitals:   06/12/24 1454  BP: (!) 141/86  Pulse: 67  Weight: 197 lb (89.4 kg)  Height: 6' (1.829 m)   Not recorded     Body mass index is 26.72 kg/m.  GENERAL EXAM: Bradley Nicholson is in no distress; well developed, nourished and groomed; neck is supple  CARDIOVASCULAR: Regular rate and rhythm, no murmurs, no carotid bruits  NEUROLOGIC: MENTAL STATUS: awake, alert, language fluent, comprehension intact, naming intact, fund of knowledge appropriate CRANIAL NERVE: pupils equal and reactive to light, visual fields full to confrontation,  extraocular muscles intact, MILD END GAZE NYSTAGMUS, facial sensation and strength symmetric, hearing intact, palate elevates symmetrically, uvula midline, shoulder shrug symmetric, tongue midline. MOTOR: normal bulk and tone, full strength in the BUE, BLE SENSORY: normal and symmetric to light touch, temperature, vibration COORDINATION: finger-nose-finger, fine finger movements normal REFLEXES: deep tendon reflexes present and symmetric GAIT/STATION: narrow based gait    DIAGNOSTIC DATA (LABS, IMAGING, TESTING) - I reviewed Bradley Nicholson records, labs, notes, testing and imaging myself where available.   Lab Results  Component Value Date   WBC 4.7 01/19/2021   HGB 13.2 01/19/2021   HCT 39.0 01/19/2021   MCV 89 01/19/2021   PLT 196 01/19/2021      Latest Ref Rng & Units 01/19/2021    1:59 PM 12/02/2018    4:18 PM 08/01/2017    3:43 PM  CMP  Glucose 65 - 99 mg/dL 898  84  73   BUN 8 - 27 mg/dL 11  12  11    Creatinine 0.76 - 1.27 mg/dL 9.02  8.93  8.90   Sodium 134 - 144 mmol/L 143  147  143   Potassium 3.5 - 5.2 mmol/L  4.4  4.0  4.1   Chloride 96 - 106 mmol/L 102  105  103   CO2 20 - 29 mmol/L 24  27  27    Calcium  8.6 - 10.2 mg/dL 9.7  9.6  9.7   Total Protein 6.0 - 8.5 g/dL 7.2  6.7  6.7   Total Bilirubin 0.0 - 1.2 mg/dL 0.6  0.4  0.6   Alkaline Phos 44 - 121 IU/L 54  56  46   AST 0 - 40 IU/L 30  44  35   ALT 0 - 44 IU/L 33  47  39    Vit D, 25-Hydroxy  Date Value Ref Range Status  01/19/2021 33.8 30.0 - 100.0 ng/mL Final    Comment:    Vitamin D  deficiency has been defined by the Institute of Medicine and an Endocrine Society practice guideline as a level of serum 25-OH vitamin D  less than 20 ng/mL (1,2). The Endocrine Society went on to further define vitamin D  insufficiency as a level between 21 and 29 ng/mL (2). 1. IOM (Institute of Medicine). 2010. Dietary reference    intakes for calcium  and D. [place]: The    [employer]. 2. Holick MF, Binkley [place],  Bischoff-Ferrari HA, et al.    Evaluation, treatment, and prevention of vitamin D     deficiency: an Endocrine Society clinical practice    guideline. JCEM. 2011 Jul; 96(7):1911-30.   12/02/2018 7.6 (L) 30.0 - 100.0 ng/mL Final    Comment:    Vitamin D  deficiency has been defined by the Institute of Medicine and an Endocrine Society practice guideline as a level of serum 25-OH vitamin D  less than 20 ng/mL (1,2). The Endocrine Society went on to further define vitamin D  insufficiency as a level between 21 and 29 ng/mL (2). 1. IOM (Institute of Medicine). 2010. Dietary reference    intakes for calcium  and D. [place]: The    [employer]. 2. Holick MF, Binkley [place], Bischoff-Ferrari HA, et al.    Evaluation, treatment, and prevention of vitamin D     deficiency: an Endocrine Society clinical practice    guideline. JCEM. 2011 Jul; 96(7):1911-30.   08/01/2017 14.6 (L) 30.0 - 100.0 ng/mL Final    Comment:    Vitamin D  deficiency has been defined by the Institute of Medicine and an Endocrine Society practice guideline as a level of serum 25-OH vitamin D  less than 20 ng/mL (1,2). The Endocrine Society went on to further define vitamin D  insufficiency as a level between 21 and 29 ng/mL (2). 1. IOM (Institute of Medicine). 2010. Dietary reference    intakes for calcium  and D. [place]: The    [employer]. 2. Holick MF, Binkley [place], Bischoff-Ferrari HA, et al.    Evaluation, treatment, and prevention of vitamin D     deficiency: an Endocrine Society clinical practice    guideline. JCEM. 2011 Jul; 96(7):1911-30.    No results found for: CHOL, HDL, LDLCALC, LDLDIRECT, TRIG, CHOLHDL No results found for: YHAJ8R No results found for: VITAMINB12 No results found for: TSH    09/15/10 MRI brain - (report states normal brain); However I reviewed images myself and there is a subtle T2 hyperintense lesion in the midbrain on the left adjacent to the  aqueduct, near the superior colliculus and fourth cranial nerve nucleus. Stable from prior studies. No abnl enhancement. -VRP  09/15/10 MRI cervical spine: 1. Stable, normal MRI appearance of the cervical and visualized thoracic spinal cord.  2. No  acute findings in the cervical spine. Cervical degenerative changes, maximal at C4-C5.  01/06/15 MRI brain (with and without) demonstrating: 1. Subtle, stable T2 hyperintense lesion near the peri-aqueductal gray on the left side. No abnormal lesions are seen on post contrast views. May represent chronic demyelinating plaque or other non-specific autoimmune/inflammatory etiology. 2. No significant change from MRI on 09/14/10.   01/06/15 MRI cervical spine (with and without) demonstrating: 1. At C4-5: disc bulging and uncovertebral joint hypertrophy with moderate biforaminal stenosis. 2. No intrinsic or abnormal enhancing spinal cord lesions. 3. No significant change from MRI on 09/14/10.     ASSESSMENT AND PLAN  63 y.o. year old male here with multiple sclerosis since 2006. Symptoms stable on rebif .   Dx:  MS (multiple sclerosis) (HCC)    PLAN:  MULTIPLE SCLEROSIS (stable over many years; MRIs show minimal changes; may consider to taper off rebif  in future given mild course and age) - continue rebif ; stable; check labs per PCP - contnue vitamin D  supplement  Return in about 1 year (around 06/12/2025).    EDUARD FABIENE HANLON, MD 06/12/2024, 3:22 PM Certified in Neurology, Neurophysiology and Neuroimaging  [facility] [address] [place], [place] [address] [phone]

## 2024-06-13 ENCOUNTER — Other Ambulatory Visit (HOSPITAL_COMMUNITY): Payer: Self-pay

## 2024-06-16 ENCOUNTER — Other Ambulatory Visit (HOSPITAL_COMMUNITY): Payer: Self-pay

## 2024-06-16 ENCOUNTER — Other Ambulatory Visit: Payer: Self-pay

## 2024-06-17 ENCOUNTER — Telehealth: Payer: Self-pay

## 2024-06-17 ENCOUNTER — Other Ambulatory Visit: Payer: Self-pay

## 2024-06-17 ENCOUNTER — Other Ambulatory Visit (HOSPITAL_COMMUNITY): Payer: Self-pay

## 2024-06-17 NOTE — Telephone Encounter (Signed)
 Pharmacy Patient Advocate Encounter   Received notification from Patient Pharmacy that prior authorization for Rebif  is required/requested.   Insurance verification completed.   The patient is insured through [facility] .   Per test claim: PA required; PA submitted to above mentioned insurance via Latent Key/confirmation #/EOC [facility] Status is pending

## 2024-06-18 ENCOUNTER — Other Ambulatory Visit: Payer: Self-pay

## 2024-06-18 ENCOUNTER — Other Ambulatory Visit (HOSPITAL_COMMUNITY): Payer: Self-pay

## 2024-06-19 ENCOUNTER — Other Ambulatory Visit: Payer: Self-pay

## 2024-06-20 ENCOUNTER — Other Ambulatory Visit (HOSPITAL_COMMUNITY): Payer: Self-pay

## 2024-06-20 ENCOUNTER — Other Ambulatory Visit: Payer: Self-pay

## 2024-06-20 NOTE — Progress Notes (Signed)
 Patient was left a voice mail that PA was approved and new delivery date is 06/24/24. Patient was advised to call the pharmacy if they wish to pick up medication instead.

## 2024-06-20 NOTE — Telephone Encounter (Signed)
 Pharmacy Patient Advocate Encounter  Received notification from [facility] that Prior Authorization for Rebif  has been APPROVED from 06/19/2024 to 06/18/2025   PA #/Case ID/Reference #: [id number]

## 2024-06-23 ENCOUNTER — Other Ambulatory Visit: Payer: Self-pay

## 2024-07-14 ENCOUNTER — Other Ambulatory Visit (HOSPITAL_COMMUNITY): Payer: Self-pay

## 2024-07-14 ENCOUNTER — Other Ambulatory Visit: Payer: Self-pay

## 2024-07-15 ENCOUNTER — Other Ambulatory Visit (HOSPITAL_COMMUNITY): Payer: Self-pay

## 2024-07-16 ENCOUNTER — Other Ambulatory Visit: Payer: Self-pay

## 2024-07-16 NOTE — Progress Notes (Signed)
 Specialty Pharmacy Refill Coordination Note  Bradley Nicholson is a 63 y.o. male contacted today regarding refills of specialty medication(s) Interferon Beta-1a  (Rebif )   Patient requested Delivery   Delivery date: 07/18/24   Verified address: [address]  [place]   Medication will be filled on 07/17/24.

## 2024-08-07 ENCOUNTER — Other Ambulatory Visit: Payer: Self-pay

## 2024-08-07 ENCOUNTER — Other Ambulatory Visit: Payer: Self-pay | Admitting: Physician Assistant

## 2024-08-07 ENCOUNTER — Other Ambulatory Visit (HOSPITAL_COMMUNITY): Payer: Self-pay

## 2024-08-07 DIAGNOSIS — L309 Dermatitis, unspecified: Secondary | ICD-10-CM | POA: Diagnosis not present

## 2024-08-07 NOTE — Progress Notes (Signed)
 Specialty Pharmacy Refill Coordination Note  Bradley Nicholson is a 63 y.o. male contacted today regarding refills of specialty medication(s) Interferon Beta-1a  (Rebif )   Patient requested Delivery   Delivery date: 08/15/24   Verified address: [address]      Medication will be filled on 10.09.25.

## 2024-08-08 ENCOUNTER — Other Ambulatory Visit (HOSPITAL_COMMUNITY): Payer: Self-pay

## 2024-08-08 LAB — DERMATOLOGY PATHOLOGY

## 2024-09-04 ENCOUNTER — Other Ambulatory Visit: Payer: Self-pay

## 2024-09-04 ENCOUNTER — Other Ambulatory Visit (HOSPITAL_COMMUNITY): Payer: Self-pay

## 2024-09-04 ENCOUNTER — Other Ambulatory Visit: Payer: Self-pay | Admitting: Pharmacist

## 2024-09-04 MED ORDER — INTERFERON BETA-1A 44 MCG/0.5ML ~~LOC~~ SOSY
PREFILLED_SYRINGE | SUBCUTANEOUS | 2 refills | Status: DC
  Filled 2024-09-04: qty 6, fill #0

## 2024-09-04 MED ORDER — REBIF 44 MCG/0.5ML ~~LOC~~ SOSY
PREFILLED_SYRINGE | SUBCUTANEOUS | 2 refills | Status: DC
  Filled 2024-09-04: qty 6, fill #0
  Filled 2024-09-08: qty 6, 28d supply, fill #0
  Filled 2024-10-01 – 2024-10-07 (×2): qty 6, 28d supply, fill #1
  Filled 2024-10-28: qty 6, 28d supply, fill #2

## 2024-09-05 ENCOUNTER — Other Ambulatory Visit (HOSPITAL_COMMUNITY): Payer: Self-pay

## 2024-09-08 ENCOUNTER — Other Ambulatory Visit: Payer: Self-pay

## 2024-09-10 ENCOUNTER — Other Ambulatory Visit: Payer: Self-pay

## 2024-09-10 NOTE — Progress Notes (Signed)
 Specialty Pharmacy Refill Coordination Note  Bradley Nicholson is a 63 y.o. male contacted today regarding refills of specialty medication(s) Interferon Beta-1a  (Rebif )   Patient requested Delivery   Delivery date: 09/12/24   Verified address: [address]  [place]   Medication will be filled on: 09/11/24

## 2024-10-01 ENCOUNTER — Other Ambulatory Visit: Payer: Self-pay

## 2024-10-03 ENCOUNTER — Other Ambulatory Visit: Payer: Self-pay

## 2024-10-07 ENCOUNTER — Other Ambulatory Visit: Payer: Self-pay

## 2024-10-07 NOTE — Progress Notes (Signed)
 Specialty Pharmacy Refill Coordination Note  Bradley Nicholson is a 63 y.o. male contacted today regarding refills of specialty medication(s) Interferon Beta-1a  (Rebif )   Patient requested Delivery   Delivery date: 10/10/24   Verified address: [address]  [place]   Medication will be filled on: 10/09/24

## 2024-10-09 ENCOUNTER — Other Ambulatory Visit: Payer: Self-pay

## 2024-10-13 ENCOUNTER — Other Ambulatory Visit (HOSPITAL_COMMUNITY): Payer: Self-pay

## 2024-10-15 ENCOUNTER — Telehealth: Payer: Self-pay | Admitting: Pharmacist

## 2024-10-15 NOTE — Telephone Encounter (Signed)
 Called patient to schedule an appointment for the [facility] [employer] Specialty Medication Clinic. I was unable to reach the patient so I left a HIPAA-compliant message requesting that the patient return my call.   Herlene Fleeta Morris, PharmD, JAQUELINE, CPP Clinical Pharmacist [facility] & [facility] [phone]

## 2024-10-28 ENCOUNTER — Other Ambulatory Visit: Payer: Self-pay

## 2024-10-29 ENCOUNTER — Other Ambulatory Visit: Payer: Self-pay

## 2024-10-31 ENCOUNTER — Other Ambulatory Visit (HOSPITAL_COMMUNITY): Payer: Self-pay

## 2024-11-04 ENCOUNTER — Other Ambulatory Visit (HOSPITAL_COMMUNITY): Payer: Self-pay

## 2024-11-05 ENCOUNTER — Other Ambulatory Visit: Payer: Self-pay

## 2024-11-05 NOTE — Progress Notes (Signed)
 Specialty Pharmacy Refill Coordination Note  PERRIN GENS is a 63 y.o. male contacted today regarding refills of specialty medication(s) Interferon Beta-1a  (Rebif )   Patient requested Delivery   Delivery date: 11/11/24   Verified address: [address]  [place]   Medication will be filled on: 11/10/24

## 2024-11-10 ENCOUNTER — Other Ambulatory Visit: Payer: Self-pay

## 2024-11-28 ENCOUNTER — Other Ambulatory Visit: Payer: Self-pay

## 2024-11-28 ENCOUNTER — Encounter (HOSPITAL_COMMUNITY): Payer: Self-pay

## 2024-11-28 ENCOUNTER — Other Ambulatory Visit (HOSPITAL_COMMUNITY): Payer: Self-pay

## 2024-11-28 MED ORDER — INTERFERON BETA-1A 44 MCG/0.5ML ~~LOC~~ SOSY
PREFILLED_SYRINGE | SUBCUTANEOUS | 2 refills | Status: DC
  Filled 2024-11-28: qty 6, fill #0

## 2024-11-28 MED ORDER — ADBRY 300 MG/2ML ~~LOC~~ SOAJ
SUBCUTANEOUS | 6 refills | Status: DC
  Filled 2024-11-28: qty 4, fill #0

## 2024-11-28 MED ORDER — ADBRY 300 MG/2ML ~~LOC~~ SOAJ
SUBCUTANEOUS | 0 refills | Status: DC
  Filled 2024-11-28: qty 4, fill #0

## 2024-11-28 NOTE — Progress Notes (Signed)
 Pharmacy Patient Advocate Encounter  Insurance verification completed.   The patient is insured through [facility]   Ran test claim for Adbry. PA required  This test claim was processed through [facility]- copay amounts may vary at other pharmacies due to pharmacy/plan contracts, or as the patient moves through the different stages of their insurance plan.

## 2024-11-28 NOTE — Progress Notes (Signed)
 Benefits investigation started in new encounter. Sent to [facility] for employee clinic visit

## 2024-12-01 ENCOUNTER — Other Ambulatory Visit: Payer: Self-pay | Admitting: Pharmacist

## 2024-12-01 ENCOUNTER — Other Ambulatory Visit: Payer: Self-pay

## 2024-12-01 ENCOUNTER — Ambulatory Visit: Payer: Self-pay | Attending: Internal Medicine | Admitting: Pharmacist

## 2024-12-01 ENCOUNTER — Other Ambulatory Visit (HOSPITAL_COMMUNITY): Payer: Self-pay

## 2024-12-01 ENCOUNTER — Telehealth: Payer: Self-pay

## 2024-12-01 DIAGNOSIS — Z7189 Other specified counseling: Secondary | ICD-10-CM

## 2024-12-01 MED ORDER — ADBRY 300 MG/2ML ~~LOC~~ SOAJ
600.0000 mg | SUBCUTANEOUS | 0 refills | Status: AC
  Filled 2024-12-02: qty 4, fill #0
  Filled 2024-12-08 – 2024-12-09 (×3): qty 4, 15d supply, fill #0

## 2024-12-01 MED ORDER — ADBRY 300 MG/2ML ~~LOC~~ SOAJ
SUBCUTANEOUS | 6 refills | Status: AC
  Filled 2024-12-02: qty 4, fill #0

## 2024-12-01 MED ORDER — REBIF 44 MCG/0.5ML ~~LOC~~ SOSY
PREFILLED_SYRINGE | SUBCUTANEOUS | 2 refills | Status: AC
  Filled 2024-12-01: qty 6, 28d supply, fill #0

## 2024-12-01 NOTE — Progress Notes (Signed)
 Specialty Pharmacy Refill Coordination Note  Bradley Nicholson is a 64 y.o. male contacted today regarding refills of specialty medication(s) Interferon Beta-1a  (REBIF )   Patient requested Delivery   Delivery date: 12/05/24   Verified address: [address]  [place]   Medication will be filled on: 12/04/24   This fill date is pending response to refill request from provider. Patient is aware and if they have not received fill by intended date they must follow up with pharmacy. Patient was provided Luke's number to call back.

## 2024-12-01 NOTE — Progress Notes (Signed)
 Please see OV from 12/01/24 for complete documentation.   Herlene Fleeta Morris, PharmD, JAQUELINE, CPP Clinical Pharmacist [facility] [phone]

## 2024-12-01 NOTE — Progress Notes (Signed)
   S:  Patient presents for review of their specialty medication therapy.  Patient is currently taking Rebif for multiple sclerosis (MS). Patient is managed by Dr. Marjory Lies for this.   Adherence: denies any missed doses  Efficacy: patient is pleased with results   Dosing: Rebif (subQ): 44 mcg 3 times weekly (separate doses by at least 48 hours)  Drug-drug interactions: none  Monitoring: CBC: last one 01/2021, WNL LFTs: last one 01/2021, WNL Thyroid function tests: no recent tests on file Injection site reactions: none S/sx of malignancy: none Neuropsychiatric symptoms: none Thrombotic microangiopathy (monitor for new onset HTN, thrombocytopenia, or impaired renal dysfunction): none  O:  Lab Results  Component Value Date   WBC 4.7 01/19/2021   HGB 13.2 01/19/2021   HCT 39.0 01/19/2021   MCV 89 01/19/2021   PLT 196 01/19/2021      Chemistry      Component Value Date/Time   NA 143 01/19/2021 1359   K 4.4 01/19/2021 1359   CL 102 01/19/2021 1359   CO2 24 01/19/2021 1359   BUN 11 01/19/2021 1359   CREATININE 0.97 01/19/2021 1359      Component Value Date/Time   CALCIUM 9.7 01/19/2021 1359   ALKPHOS 54 01/19/2021 1359   AST 30 01/19/2021 1359   ALT 33 01/19/2021 1359   BILITOT 0.6 01/19/2021 1359      No results found for: "TSH"   A/P: 1. Medication review: Patient currently on Rebif for the treatment of MS and is tolerating it well with no changes since last visit. Reviewed the medication with the patient, including the following: Rebif, interferon beta-1a, is an interferon indicated for the treatment of MS. Patient educated on purpose, proper use and potential adverse effects of Rebif. Analgesics and/or antipyretics may help decrease flu-like symptoms on treatment days. No recommendations for any changes.   Butch Penny, PharmD, [place], CPP Clinical Pharmacist [facility] [phone]

## 2024-12-01 NOTE — Progress Notes (Signed)
" ° °  S: Patient presents today for review of their specialty medication.   Patient is currently taking Adbry  for atopic dermatitis. Patient is managed by Dr. Elnor  for this.   Dosing: 600 mg x1 followed by 300 mg subcutaneous once every 2 weeks.   Adherence: has not started  Efficacy: has not started   Monitoring:  -CBC/Eosinophils: results from 2022 normal  Current adverse effects: -Injection site reaction: none  -Upper respiratory infection symptoms: none  -Conjunctivitis/vision changes: none   O:     Lab Results  Component Value Date   WBC 4.7 01/19/2021   HGB 13.2 01/19/2021   HCT 39.0 01/19/2021   MCV 89 01/19/2021   PLT 196 01/19/2021      Chemistry      Component Value Date/Time   NA 143 01/19/2021 1359   K 4.4 01/19/2021 1359   CL 102 01/19/2021 1359   CO2 24 01/19/2021 1359   BUN 11 01/19/2021 1359   CREATININE 0.97 01/19/2021 1359      Component Value Date/Time   CALCIUM  9.7 01/19/2021 1359   ALKPHOS 54 01/19/2021 1359   AST 30 01/19/2021 1359   ALT 33 01/19/2021 1359   BILITOT 0.6 01/19/2021 1359       A/P: 1. Medication review: patient currently on Adbry  for atopic dermatitis. Reviewed the medication with the patient including the following: tralokinumab  is an IL-13 antagonist used in the tx of atopic dermatitis. It is given as a [place] injection. The PFS should be stored in the fridge and taken out to reach room temperature for 30 minutes prior to use. Injections should administered in the thigh or lower abdomen. Common side effects include: upper respiratory infection, injection site reaction, and conjunctivitis. No recommendations for changes at this time.    Herlene Fleeta Morris, PharmD, JAQUELINE, CPP Clinical Pharmacist [facility] [phone]     "

## 2024-12-01 NOTE — Addendum Note (Signed)
 Addended by: FLEETA MORRIS, HERLENE L on: 12/01/2024 03:22 PM   Modules accepted: Orders

## 2024-12-01 NOTE — Telephone Encounter (Signed)
 Pharmacy Patient Advocate Encounter   Received notification from Pt Calls Messages that prior authorization for Adbry  is required/requested.   Insurance verification completed.   The patient is insured through [facility].   Per test claim: PA required; PA submitted to above mentioned insurance via Latent Key/confirmation #/EOC [id number] Status is pending

## 2024-12-02 ENCOUNTER — Other Ambulatory Visit: Payer: Self-pay

## 2024-12-02 NOTE — Progress Notes (Signed)
 PA approved. Loading dose must be submitted for 6 mls per 28 days. Requested new loading and maintenance dose prescriptions from office. Waiting on response.

## 2024-12-02 NOTE — Telephone Encounter (Signed)
 Pharmacy Patient Advocate Encounter  Received notification from [facility] that Prior Authorization for Adbry  has been APPROVED from 11/27/24 to 05/24/25   PA #/Case ID/Reference #: [id number]

## 2024-12-03 ENCOUNTER — Other Ambulatory Visit: Payer: Self-pay

## 2024-12-04 ENCOUNTER — Other Ambulatory Visit: Payer: Self-pay

## 2024-12-05 ENCOUNTER — Other Ambulatory Visit (HOSPITAL_COMMUNITY): Payer: Self-pay

## 2024-12-05 ENCOUNTER — Other Ambulatory Visit: Payer: Self-pay

## 2024-12-05 NOTE — Progress Notes (Signed)
 Spoke with April at office- message being sent to provider/nurse to send in new prescriptions for loading and maintenance dose.   Loading dose should be 6 mls-Inject on day 1, on day 15  Maintenance dose should be 4 mls-inject 2mls every 2 weeks

## 2024-12-08 ENCOUNTER — Other Ambulatory Visit: Payer: Self-pay

## 2024-12-08 ENCOUNTER — Other Ambulatory Visit (HOSPITAL_COMMUNITY): Payer: Self-pay

## 2024-12-08 NOTE — Progress Notes (Signed)
 Pharmacy Patient Advocate Encounter  Insurance verification completed.   The patient is insured through [employer]   Ran test claim for Adbry . Currently a quantity of 4 is a 15 day supply and the co-pay is $1,711.26. Copay card on file brings copay to $0  This test claim was processed through [facility]- copay amounts may vary at other pharmacies due to pharmacy/plan contracts, or as the patient moves through the different stages of their insurance plan.

## 2024-12-08 NOTE — Progress Notes (Signed)
 PA was amended for loading dose-35ml per 15 days

## 2024-12-08 NOTE — Telephone Encounter (Signed)
 PA was amended for loading dose-35ml per 15 days

## 2024-12-09 ENCOUNTER — Other Ambulatory Visit: Payer: Self-pay

## 2024-12-09 NOTE — Progress Notes (Signed)
 Specialty Pharmacy Initial Fill Coordination Note  Bradley Nicholson is a 64 y.o. male contacted today regarding initial fill of specialty medication(s) Tralokinumab -ldrm (Adbry )   Patient requested Delivery   Delivery date: 12/12/24   Verified address: [address]   Medication will be filled on: 12/11/24   Patient is aware of $0 copayment. Patient has copay card

## 2024-12-10 ENCOUNTER — Other Ambulatory Visit (HOSPITAL_COMMUNITY): Payer: Self-pay

## 2024-12-10 ENCOUNTER — Other Ambulatory Visit: Payer: Self-pay

## 2024-12-10 MED ORDER — ADBRY 300 MG/2ML ~~LOC~~ SOAJ: 300.0000 mg | SUBCUTANEOUS | 6 refills | Status: AC

## 2024-12-10 MED ORDER — ADBRY 300 MG/2ML ~~LOC~~ SOAJ
600.0000 mg | Freq: Once | SUBCUTANEOUS | 0 refills | Status: AC
  Filled 2024-12-10: qty 4, 1d supply, fill #0

## 2024-12-11 ENCOUNTER — Other Ambulatory Visit (HOSPITAL_COMMUNITY): Payer: Self-pay

## 2024-12-11 ENCOUNTER — Other Ambulatory Visit: Payer: Self-pay

## 2025-06-15 ENCOUNTER — Ambulatory Visit: Admitting: Diagnostic Neuroimaging
